# Patient Record
Sex: Female | Born: 1950
Health system: Southern US, Community
[De-identification: ages and names within clinical notes are randomized; demographics above are authoritative.]

## PROBLEM LIST (undated history)

## (undated) DIAGNOSIS — M1990 Unspecified osteoarthritis, unspecified site: Secondary | ICD-10-CM

## (undated) DIAGNOSIS — R252 Cramp and spasm: Secondary | ICD-10-CM

## (undated) HISTORY — PX: ABDOMINAL HYSTERECTOMY: SHX81

## (undated) HISTORY — PX: COLONOSCOPY WITH PROPOFOL: SHX5780

---

## 2004-03-19 ENCOUNTER — Other Ambulatory Visit: Payer: Self-pay

## 2004-07-15 ENCOUNTER — Emergency Department: Payer: Self-pay | Admitting: Emergency Medicine

## 2004-07-21 ENCOUNTER — Observation Stay: Payer: Self-pay | Admitting: General Surgery

## 2005-09-26 ENCOUNTER — Ambulatory Visit (HOSPITAL_COMMUNITY): Admission: RE | Admit: 2005-09-26 | Discharge: 2005-09-26 | Payer: Self-pay | Admitting: Specialist

## 2007-08-21 ENCOUNTER — Ambulatory Visit (HOSPITAL_COMMUNITY): Admission: RE | Admit: 2007-08-21 | Discharge: 2007-08-21 | Payer: Self-pay | Admitting: Specialist

## 2009-03-04 ENCOUNTER — Ambulatory Visit: Payer: Self-pay | Admitting: Family Medicine

## 2010-08-06 ENCOUNTER — Encounter: Payer: Self-pay | Admitting: Specialist

## 2010-10-27 ENCOUNTER — Ambulatory Visit: Payer: Self-pay

## 2010-11-06 ENCOUNTER — Ambulatory Visit: Payer: Self-pay | Admitting: Family Medicine

## 2011-01-06 ENCOUNTER — Emergency Department: Payer: Self-pay | Admitting: Internal Medicine

## 2011-02-01 ENCOUNTER — Emergency Department: Payer: Self-pay | Admitting: Emergency Medicine

## 2012-06-25 ENCOUNTER — Ambulatory Visit: Payer: Self-pay | Admitting: Family Medicine

## 2012-09-18 ENCOUNTER — Ambulatory Visit: Payer: Self-pay | Admitting: Family Medicine

## 2013-04-11 ENCOUNTER — Emergency Department: Payer: Self-pay | Admitting: Emergency Medicine

## 2014-10-14 ENCOUNTER — Ambulatory Visit
Admit: 2014-10-14 | Disposition: A | Discharge status: Home / Self Care | Payer: Self-pay | Attending: Specialist | Admitting: Specialist

## 2014-10-26 ENCOUNTER — Encounter: Payer: Self-pay | Admitting: Podiatry

## 2014-10-26 ENCOUNTER — Ambulatory Visit (INDEPENDENT_AMBULATORY_CARE_PROVIDER_SITE_OTHER): Payer: BC Managed Care – PPO

## 2014-10-26 ENCOUNTER — Ambulatory Visit (INDEPENDENT_AMBULATORY_CARE_PROVIDER_SITE_OTHER): Payer: BC Managed Care – PPO | Admitting: Podiatry

## 2014-10-26 VITALS — BP 129/77 | HR 64 | Resp 16 | Ht 64.0 in | Wt 242.0 lb

## 2014-10-26 DIAGNOSIS — L84 Corns and callosities: Secondary | ICD-10-CM | POA: Diagnosis not present

## 2014-10-26 DIAGNOSIS — M216X9 Other acquired deformities of unspecified foot: Secondary | ICD-10-CM | POA: Diagnosis not present

## 2014-10-26 DIAGNOSIS — M722 Plantar fascial fibromatosis: Secondary | ICD-10-CM | POA: Diagnosis not present

## 2014-10-26 NOTE — Progress Notes (Signed)
   Subjective:    Patient ID: Nicole Jensen, female    DOB: 01/10/1951, 64 y.o.   MRN: [id number]  HPI 64 year old female presents the office with complaints of pain to bilateral feet all side of hyperkeratotic lesions in the ball of her feet. She has a states that she feels like she is walking on a rock to the ball of her feet. She denies any history of injury or trauma to the area. She denies any overlying swelling or redness. She's had no prior treatment. No other complaints at this time.   Review of Systems  Constitutional: Positive for appetite change.  HENT: Positive for sinus pressure.   All other systems reviewed and are negative.      Objective:   Physical Exam AAO 3, NAD  DP/PT pulses palpable, CRT less than 3 seconds Protective sensation intact with Simms Weinstein monofilament, vibratory sensation intact, Achilles tendon reflex intact. There is prominent metatarsal heads plantarly with atrophy of the fat pad. There is hyperkeratotic lesions bilateral submetatarsal one and 5. Upon debridement there underlying skin was intact and there is no drainage, ulceration, or other clinical signs of infection. There is no other areas of tenderness to bilateral lower extremities. No overlying edema, erythema, increase in warmth b/l. MMT 5/5, ROM WNL No open lesions or pre-ulcerative lesions No pain with calf compression, swelling, warmth, erythema.      Assessment & Plan:  64 year old female with prominent metatarsal heads resulting in symptomatic hyperkeratotic lesions -X-rays were obtained and reviewed the patient. -Treatment options were discussed with the patient include alternatives, risks, competitions. -Hyperkeratotic lesions are sharply debrided 4 without complication/bleeding. -Dispensed metatarsal pads to help offload the metatarsal heads. -Discussed the patient that she likely would benefit from custom orthotics in the future. -If symptoms do not resolve within 4 weeks to  follow-up. Otherwise call the office if any questions or concerns.

## 2014-11-23 ENCOUNTER — Encounter: Payer: Self-pay | Admitting: Podiatry

## 2014-11-23 ENCOUNTER — Ambulatory Visit (INDEPENDENT_AMBULATORY_CARE_PROVIDER_SITE_OTHER): Payer: BC Managed Care – PPO | Admitting: Podiatry

## 2014-11-23 VITALS — Ht 64.0 in | Wt 242.0 lb

## 2014-11-23 DIAGNOSIS — Q828 Other specified congenital malformations of skin: Secondary | ICD-10-CM | POA: Diagnosis not present

## 2014-11-26 NOTE — Progress Notes (Signed)
Patient ID: Nicole Jensen, female   DOB: April 17, 1951, 64 y.o.   MRN: [id number]  Subjective: 64 year old female presents the office today with concerns of right submetatarsal 4 hyperkeratotic lesion. She states that when wearing the pads that were dispensed her last appointment it does help significantly however she discontinued have this callus on the bottom of her right foot. She states of the calluses painful particularly with pressure and weightbearing. Denies any drainage or redness from the area. Denies any systemic complaints such as fevers, chills, nausea, vomiting. No acute changes since last appointment, and no other complaints at this time.   Objective: AAO x3, NAD DP/PT pulses palpable bilaterally, CRT less than 3 seconds Protective sensation intact with Simms Weinstein monofilament, vibratory sensation intact, Achilles tendon reflex intact There is prominent metatarsal heads plantarly with atrophy of the fat pad. Right second metatarsal 4 nucleated  Hyperkeratotic lesion. Upon debridement lesion underlying skin is intact and there is no ulceration, drainage or other clinical signs of infection. No areas of pinpoint bony tenderness or pain with vibratory sensation. MMT 5/5, ROM WNL. No edema, erythema, increase in warmth to bilateral lower extremities.  No open lesions or other pre-ulcerative lesions.  No pain with calf compression, swelling, warmth, erythema  Assessment: 64 year old female right submetatarsal 4 porokeratosis  Plan: -All treatment options discussed with the patient including all alternatives, risks, complications.  -Lesion sharply debrided without complication/bleeding. Upon was placed around the area followed by salinocaine  And an occlusive bandage. Post procedure care was discussed the patient. Monitoring clinical signs or symptoms of infection and directed to call the office and medially should any occur. Dispensed offloading pads. I discussed with her  orthotics. - Follow-up as needed. -Patient encouraged to call the office with any questions, concerns, change in symptoms.

## 2015-05-27 ENCOUNTER — Ambulatory Visit (INDEPENDENT_AMBULATORY_CARE_PROVIDER_SITE_OTHER): Payer: BC Managed Care – PPO | Admitting: Family Medicine

## 2015-05-27 ENCOUNTER — Emergency Department
Admission: EM | Admit: 2015-05-27 | Discharge: 2015-05-27 | Disposition: A | Discharge status: Home / Self Care | Payer: BC Managed Care – PPO | Attending: Emergency Medicine | Admitting: Emergency Medicine

## 2015-05-27 ENCOUNTER — Encounter: Payer: Self-pay | Admitting: *Deleted

## 2015-05-27 ENCOUNTER — Encounter: Payer: Self-pay | Admitting: Family Medicine

## 2015-05-27 VITALS — BP 126/66 | HR 64 | Temp 98.1°F | Resp 16 | Wt 240.0 lb

## 2015-05-27 DIAGNOSIS — R252 Cramp and spasm: Secondary | ICD-10-CM | POA: Diagnosis not present

## 2015-05-27 DIAGNOSIS — J0110 Acute frontal sinusitis, unspecified: Secondary | ICD-10-CM | POA: Diagnosis not present

## 2015-05-27 DIAGNOSIS — J309 Allergic rhinitis, unspecified: Secondary | ICD-10-CM | POA: Insufficient documentation

## 2015-05-27 DIAGNOSIS — E559 Vitamin D deficiency, unspecified: Secondary | ICD-10-CM | POA: Insufficient documentation

## 2015-05-27 DIAGNOSIS — J0100 Acute maxillary sinusitis, unspecified: Secondary | ICD-10-CM | POA: Diagnosis not present

## 2015-05-27 DIAGNOSIS — J019 Acute sinusitis, unspecified: Secondary | ICD-10-CM | POA: Insufficient documentation

## 2015-05-27 DIAGNOSIS — R5383 Other fatigue: Secondary | ICD-10-CM

## 2015-05-27 DIAGNOSIS — R531 Weakness: Secondary | ICD-10-CM | POA: Diagnosis present

## 2015-05-27 DIAGNOSIS — D259 Leiomyoma of uterus, unspecified: Secondary | ICD-10-CM | POA: Insufficient documentation

## 2015-05-27 DIAGNOSIS — H469 Unspecified optic neuritis: Secondary | ICD-10-CM | POA: Insufficient documentation

## 2015-05-27 DIAGNOSIS — E7800 Pure hypercholesterolemia, unspecified: Secondary | ICD-10-CM | POA: Insufficient documentation

## 2015-05-27 HISTORY — DX: Unspecified osteoarthritis, unspecified site: M19.90

## 2015-05-27 HISTORY — DX: Cramp and spasm: R25.2

## 2015-05-27 LAB — COMPREHENSIVE METABOLIC PANEL
ALBUMIN: 3.2 g/dL — AB (ref 3.5–5.0)
ALT: 15 U/L (ref 14–54)
ANION GAP: 2 — AB (ref 5–15)
AST: 20 U/L (ref 15–41)
Alkaline Phosphatase: 78 U/L (ref 38–126)
BUN: 16 mg/dL (ref 6–20)
CHLORIDE: 109 mmol/L (ref 101–111)
CO2: 29 mmol/L (ref 22–32)
Calcium: 9.2 mg/dL (ref 8.9–10.3)
Creatinine, Ser: 0.84 mg/dL (ref 0.44–1.00)
GFR calc non Af Amer: >60 mL/min (ref >60)
GLUCOSE: 90 mg/dL (ref 65–99)
Potassium: 3.9 mmol/L (ref 3.5–5.1)
SODIUM: 140 mmol/L (ref 135–145)
Total Bilirubin: 0.3 mg/dL (ref 0.3–1.2)
Total Protein: 6.6 g/dL (ref 6.5–8.1)

## 2015-05-27 LAB — CBC
HCT: 37.4 % (ref 35.0–47.0)
Hemoglobin: 12 g/dL (ref 12.0–16.0)
MCH: 26.1 pg (ref 26.0–34.0)
MCHC: 32.1 g/dL (ref 32.0–36.0)
MCV: 81.2 fL (ref 80.0–100.0)
PLATELETS: 186 10*3/uL (ref 150–440)
RBC: 4.61 MIL/uL (ref 3.80–5.20)
RDW: 15.4 % — ABNORMAL HIGH (ref 11.5–14.5)
WBC: 5.2 10*3/uL (ref 3.6–11.0)

## 2015-05-27 LAB — MAGNESIUM: MAGNESIUM: 1.8 mg/dL (ref 1.7–2.4)

## 2015-05-27 LAB — TROPONIN I: Troponin I: <0.03 ng/mL (ref <0.031)

## 2015-05-27 MED ORDER — SODIUM CHLORIDE 0.9 % IV BOLUS (SEPSIS)
1000.0000 mL | Freq: Once | INTRAVENOUS | Status: AC
  Administered 2015-05-27: 1000 mL via INTRAVENOUS

## 2015-05-27 MED ORDER — AMOXICILLIN-POT CLAVULANATE 875-125 MG PO TABS: 1.0000 | ORAL_TABLET | Freq: Two times a day (BID) | ORAL | Status: DC

## 2015-05-27 MED ORDER — DILTIAZEM HCL 100 MG IV SOLR
5.0000 mg/h | Freq: Once | INTRAVENOUS | Status: DC
  Filled 2015-05-27: qty 100

## 2015-05-27 NOTE — ED Notes (Signed)
Pt arrived to ED from home reporting increased weakness beginning 1 hour ago. Pt reports having a hx right sided leg cramps that usually resolve with mustard intake but today weakness followed the leg cramp. Pt deneis lightheadedness or dizziness at this time. Pt is alert and oriented.

## 2015-05-27 NOTE — Progress Notes (Signed)
Subjective:     Patient ID: Nicole Jensen, female   DOB: 05-05-1951, 64 y.o.   MRN: [id number]  Sinusitis This is a new problem. The current episode started 1 to 4 weeks ago. The problem has been gradually worsening since onset. There has been no fever. The fever has been present for less than 1 day. Her pain is at a severity of 5/10. The pain is mild. Associated symptoms include congestion, sinus pressure and sneezing. Pertinent negatives include no chills, coughing, diaphoresis, ear pain, neck pain, shortness of breath, sore throat or swollen glands. Past treatments include nothing.  Muscle Pain This is a new problem. The current episode started today. The problem has been resolved since onset. The pain is present in the right upper leg. Associated symptoms include fatigue. Pertinent negatives include no abdominal pain, chest pain, diarrhea, eye pain, joint swelling, stiffness, shortness of breath, swollen glands, weakness (Pt felt weak this morning but has since improved. ) or wheezing. There is no swelling present.     Patient Active Problem List   Diagnosis Date Noted  . Allergic rhinitis 05/27/2015  . Leiomyoma of uterus 05/27/2015  . Hypercholesterolemia 05/27/2015  . Anterior optic neuritis 05/27/2015  . Avitaminosis D 05/27/2015   Family History  Problem Relation Age of Onset  . Hypertension Mother   . Lung cancer Mother   . Colon cancer Father   . Hypertension Sister   . Hypertension Sister    Social History   Social History  . Marital Status: Widowed    Spouse Name: N/A  . Number of Children: 1  . Years of Education: College   Occupational History  . Full-Time Abss   Social History Main Topics  . Smoking status: Never Smoker   . Smokeless tobacco: Never Used  . Alcohol Use: No  . Drug Use: No  . Sexual Activity: No   Other Topics Concern  . Not on file   Social History Narrative   Past Surgical History  Procedure Laterality Date  . Abdominal hysterectomy       partial.    No Known Allergies Previous Medications   IBUPROFEN (ADVIL,MOTRIN) 200 MG TABLET    Take 200 mg by mouth every 6 (six) hours as needed.   BP 126/66 mmHg  Pulse 64  Temp(Src) 98.1 F (36.7 C) (Oral)  Resp 16  Wt 240 lb (108.863 kg)  LMP  (LMP Unknown)   Review of Systems  Constitutional: Positive for fatigue. Negative for chills, diaphoresis, activity change and appetite change.  HENT: Positive for congestion, rhinorrhea, sinus pressure, sneezing and voice change. Negative for ear discharge, ear pain, facial swelling, hearing loss, nosebleeds, postnasal drip, sore throat, tinnitus and trouble swallowing.   Eyes: Negative for photophobia, pain, discharge, redness, itching and visual disturbance.  Respiratory: Negative for apnea, cough, choking, chest tightness, shortness of breath, wheezing and stridor.   Cardiovascular: Negative for chest pain, palpitations and leg swelling.  Gastrointestinal: Negative for abdominal pain, diarrhea, blood in stool, abdominal distention, anal bleeding and rectal pain.  Musculoskeletal: Positive for back pain (Some back stiffness). Negative for joint swelling, gait problem, stiffness, neck pain and neck stiffness.  Neurological: Negative for dizziness, tremors, seizures, syncope, speech difficulty, weakness (Pt felt weak this morning but has since improved. ), light-headedness and numbness.       Objective:   Physical Exam  Constitutional: She is oriented to person, place, and time. She appears well-developed and well-nourished.  HENT:  Head: Normocephalic and atraumatic.  Right Ear: Tympanic membrane and external ear normal.  Left Ear: Tympanic membrane and external ear normal.  Nose: Mucosal edema and rhinorrhea present. Right sinus exhibits maxillary sinus tenderness. Left sinus exhibits maxillary sinus tenderness.  Mouth/Throat: Uvula is midline and oropharynx is clear and moist.  Eyes: Conjunctivae and EOM are normal. Pupils are  equal, round, and reactive to light.  Neck: Normal range of motion. Neck supple.  Cardiovascular: Normal rate and regular rhythm.   Pulmonary/Chest: Effort normal and breath sounds normal. She has no wheezes. She has no rales.  Neurological: She is alert and oriented to person, place, and time.   BP 126/66 mmHg  Pulse 64  Temp(Src) 98.1 F (36.7 C) (Oral)  Resp 16  Wt 240 lb (108.863 kg)  LMP  (LMP Unknown)     Assessment:     1. Muscle cramps  2. Acute maxillary sinusitis, recurrence not specified     Plan:     1. Muscle cramps Will treat as needed   2. Acute maxillary sinusitis, recurrence not specified New problem. Condition is worsening. Will start medication for better control.  Patient instructed to call back if condition worsens or does not improve.   Will  Also follow up for CPE. - amoxicillin-clavulanate (AUGMENTIN) 875-125 MG tablet; Take 1 tablet by mouth 2 (two) times daily.  Dispense: 20 tablet; Refill: 0    Margarita Rana, MD

## 2015-05-27 NOTE — ED Provider Notes (Signed)
[facility] Emergency Department Provider Note  ____________________________________________  Time seen: 2:10 AM  I have reviewed the triage vital signs and the nursing notes.   HISTORY  Chief Complaint Weakness      HPI Nicole Jensen is a 64 y.o. female presents with history of generalized weakness on awakening approximately one hour ago patient states that she has a history of right leg cramps which awoke her from sleep this morning. Patient states cramps usually resolve after she eats mustard. Patient not any pain in her left leg at this time. Patient denies any chest pain or shortness of breath or dizziness. Patient states that she just feels weak all over"     Past Medical History  Diagnosis Date  . Leg cramps     right leg  . Arthritis     There are no active problems to display for this patient.   Past Surgical History  Procedure Laterality Date  . Abdominal hysterectomy      partial.     Current Outpatient Rx  Name  Route  Sig  Dispense  Refill  . ibuprofen (ADVIL,MOTRIN) 200 MG tablet   Oral   Take 200 mg by mouth every 6 (six) hours as needed.           Allergies   History reviewed. No pertinent family history.  Social History Social History  Substance Use Topics  . Smoking status: Never Smoker   . Smokeless tobacco: Never Used  . Alcohol Use: No    Review of Systems  Constitutional: Negative for fever. Eyes: Negative for visual changes. ENT: Negative for sore throat. Cardiovascular: Negative for chest pain. Respiratory: Negative for shortness of breath. Gastrointestinal: Negative for abdominal pain, vomiting and diarrhea. Genitourinary: Negative for dysuria. Musculoskeletal: Negative for back pain. Skin: Negative for rash. Neurological: Negative for headaches, focal weakness or numbness. Positive for generalized fatigue   10-point ROS otherwise  negative.  ____________________________________________   PHYSICAL EXAM:  VITAL SIGNS: ED Triage Vitals  Enc Vitals Group     BP 05/27/15 0203 133/67 mmHg     Pulse Rate 05/27/15 0203 69     Resp 05/27/15 0203 16     Temp 05/27/15 0205 97.5 F (36.4 C)     Temp Source 05/27/15 0205 Oral     SpO2 05/27/15 0203 98 %     Weight 05/27/15 0203 220 lb (99.791 kg)     Height 05/27/15 0203 5\' 4"  (1.626 m)     Head Cir --      Peak Flow --      Pain Score --      Pain Loc --      Pain Edu? --      Excl. in [place]? --      Constitutional: Alert and oriented. Well appearing and in no distress. Eyes: Conjunctivae are normal. PERRL. Normal extraocular movements. ENT   Head: Normocephalic and atraumatic.   Nose: No congestion/rhinnorhea.   Mouth/Throat: Mucous membranes are moist.   Neck: No stridor. Cardiovascular: Normal rate, regular rhythm. Normal and symmetric distal pulses are present in all extremities. No murmurs, rubs, or gallops. Respiratory: Normal respiratory effort without tachypnea nor retractions. Breath sounds are clear and equal bilaterally. No wheezes/rales/rhonchi. Gastrointestinal: Soft and nontender. No distention. There is no CVA tenderness. Genitourinary: deferred Musculoskeletal: Nontender with normal range of motion in all extremities. No joint effusions.  No lower extremity tenderness nor edema. Neurologic:  Normal speech and language. No gross focal neurologic  deficits are appreciated. Speech is normal.  Skin:  Skin is warm, dry and intact. No rash noted. Psychiatric: Mood and affect are normal. Speech and behavior are normal. Patient exhibits appropriate insight and judgment.  ____________________________________________    LABS (pertinent positives/negatives)  Labs Reviewed  CBC - Abnormal; Notable for the following:    RDW 15.4 (*)    All other components within normal limits  COMPREHENSIVE METABOLIC PANEL - Abnormal; Notable for the  following:    Albumin 3.2 (*)    Anion gap 2 (*)    All other components within normal limits  TROPONIN I  MAGNESIUM     ____________________________________________   EKG  ED ECG REPORT I, BROWN, [place] N, the attending physician, personally viewed and interpreted this ECG.   Date: 05/27/2015  EKG Time: 4:28 AM  Rate: 66  Rhythm: Normal sinus rhythm  Axis: None  Intervals: Normal  ST&T Change: None       INITIAL IMPRESSION / ASSESSMENT AND PLAN / ED COURSE  Pertinent labs & imaging results that were available during my care of the patient were reviewed by me and considered in my medical decision making (see chart for details).  No clear etiology of the patient's generalized weakness. Patient alert and oriented with No focal neurological deficits noted on exam absence of headache as such CT scan of the head was not performed. Laboratory data unremarkable.  ____________________________________________   FINAL CLINICAL IMPRESSION(S) / ED DIAGNOSES  Final diagnoses:  Other fatigue      Gregor Hams, MD 05/31/15 2235

## 2015-05-27 NOTE — ED Notes (Signed)
Patient with no complaints at this time. Respirations even and unlabored. Skin warm/dry. Discharge instructions reviewed with patient at this time. Patient given opportunity to voice concerns/ask questions. IV removed per policy and band-aid applied to site. Patient discharged at this time and left Emergency Department with steady gait, accompanied by family member. Offered wheelchair assistance, patient denied at this time.

## 2015-05-27 NOTE — Discharge Instructions (Signed)
Fatigue  Fatigue is feeling tired all of the time, a lack of energy, or a lack of motivation. Occasional or mild fatigue is often a normal response to activity or life in general. However, long-lasting (chronic) or extreme fatigue may indicate an underlying medical condition.  HOME CARE INSTRUCTIONS   Watch your fatigue for any changes. The following actions may help to lessen any discomfort you are feeling:  · Talk to your health care provider about how much sleep you need each night. Try to get the required amount every night.  · Take medicines only as directed by your health care provider.  · Eat a healthy and nutritious diet. Ask your health care provider if you need help changing your diet.  · Drink enough fluid to keep your urine clear or pale yellow.  · Practice ways of relaxing, such as yoga, meditation, massage therapy, or acupuncture.  · Exercise regularly.    · Change situations that cause you stress. Try to keep your work and personal routine reasonable.  · Do not abuse illegal drugs.  · Limit alcohol intake to no more than 1 drink per day for nonpregnant women and 2 drinks per day for men. One drink equals 12 ounces of beer, 5 ounces of wine, or 1½ ounces of hard liquor.  · Take a multivitamin, if directed by your health care provider.  SEEK MEDICAL CARE IF:   · Your fatigue does not get better.  · You have a fever.    · You have unintentional weight loss or gain.  · You have headaches.    · You have difficulty:      Falling asleep.    Sleeping throughout the night.  · You feel angry, guilty, anxious, or sad.     · You are unable to have a bowel movement (constipation).    · You skin is dry.     · Your legs or another part of your body is swollen.    SEEK IMMEDIATE MEDICAL CARE IF:   · You feel confused.    · Your vision is blurry.  · You feel faint or pass out.    · You have a severe headache.    · You have severe abdominal, pelvic, or back pain.    · You have chest pain, shortness of breath, or an  irregular or fast heartbeat.    · You are unable to urinate or you urinate less than normal.    · You develop abnormal bleeding, such as bleeding from the rectum, vagina, nose, lungs, or nipples.  · You vomit blood.     · You have thoughts about harming yourself or committing suicide.    · You are worried that you might harm someone else.       This information is not intended to replace advice given to you by your health care provider. Make sure you discuss any questions you have with your health care provider.     Document Released: 04/29/2007 Document Revised: 07/23/2014 Document Reviewed: 11/03/2013  Elsevier Interactive Patient Education ©2016 [employer]

## 2015-06-13 ENCOUNTER — Encounter: Payer: Self-pay | Admitting: Family Medicine

## 2015-06-24 ENCOUNTER — Encounter: Payer: Self-pay | Admitting: Family Medicine

## 2015-06-24 ENCOUNTER — Ambulatory Visit (INDEPENDENT_AMBULATORY_CARE_PROVIDER_SITE_OTHER): Payer: BC Managed Care – PPO | Admitting: Family Medicine

## 2015-06-24 VITALS — BP 116/80 | HR 70 | Temp 97.9°F | Resp 16 | Wt 243.0 lb

## 2015-06-24 DIAGNOSIS — J0100 Acute maxillary sinusitis, unspecified: Secondary | ICD-10-CM | POA: Diagnosis not present

## 2015-06-24 DIAGNOSIS — J309 Allergic rhinitis, unspecified: Secondary | ICD-10-CM

## 2015-06-24 DIAGNOSIS — J029 Acute pharyngitis, unspecified: Secondary | ICD-10-CM | POA: Diagnosis not present

## 2015-06-24 LAB — POCT RAPID STREP A (OFFICE): Rapid Strep A Screen: NEGATIVE

## 2015-06-24 MED ORDER — FLUTICASONE PROPIONATE 50 MCG/ACT NA SUSP: 2.0000 | Freq: Every day | NASAL | Status: DC

## 2015-06-24 MED ORDER — AMOXICILLIN-POT CLAVULANATE 875-125 MG PO TABS: 1.0000 | ORAL_TABLET | Freq: Two times a day (BID) | ORAL | Status: DC

## 2015-06-24 NOTE — Progress Notes (Signed)
Subjective:    Patient ID: Nicole Jensen, female    DOB: 11-08-1950, 64 y.o.   MRN: [id number]  URI  This is a new problem. The current episode started 1 to 4 weeks ago (x 2 weeks). The problem has been gradually worsening (Feels liek something stuck in back of throat.  ). There has been no fever. Associated symptoms include congestion, coughing (productive with green/ gray sputum), headaches, rhinorrhea, sinus pain, sneezing and a sore throat. Pertinent negatives include no abdominal pain, chest pain, diarrhea, dysuria, ear pain, nausea, neck pain, plugged ear sensation, swollen glands, vomiting or wheezing. Treatments tried: cough drops, gargling vinegar. No prescription mediication.   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      Review of Systems  HENT: Positive for congestion, rhinorrhea, sneezing and sore throat. Negative for ear pain.   Respiratory: Positive for cough (productive with green/ gray sputum). Negative for wheezing.   Cardiovascular: Negative for chest pain.  Gastrointestinal: Negative for nausea, vomiting, abdominal pain and diarrhea.  Genitourinary: Negative for dysuria.  Musculoskeletal: Negative for neck pain.  Neurological: Positive for headaches.   BP 116/80 mmHg  Pulse 70  Temp(Src) 97.9 F (36.6 C) (Oral)  Resp 16  Wt 243 lb (110.224 kg)  SpO2 100%  LMP  (LMP Unknown)   Patient Active Problem List   Diagnosis Date Noted  . Allergic rhinitis 05/27/2015  . Leiomyoma of uterus 05/27/2015  . Hypercholesterolemia 05/27/2015  . Anterior optic neuritis 05/27/2015  . Avitaminosis D 05/27/2015  . Sinusitis, acute 05/27/2015   Past Medical History  Diagnosis Date  . Leg cramps     right leg  . Arthritis    Current Outpatient Prescriptions on File Prior to Visit  Medication Sig  . aspirin 81 MG tablet Take 81 mg by mouth daily.  Marland Kitchen ibuprofen (ADVIL,MOTRIN) 200 MG tablet Take 200 mg by mouth every 6 (six) hours as needed.   No  current facility-administered medications on file prior to visit.   No Known Allergies Past Surgical History  Procedure Laterality Date  . Abdominal hysterectomy      partial.    Social History   Social History  . Marital Status: Widowed    Spouse Name: N/A  . Number of Children: 1  . Years of Education: College   Occupational History  . Full-Time Abss   Social History Main Topics  . Smoking status: Never Smoker   . Smokeless tobacco: Never Used  . Alcohol Use: No  . Drug Use: No  . Sexual Activity: No   Other Topics Concern  . Not on file   Social History Narrative   Family History  Problem Relation Age of Onset  . Hypertension Mother   . Lung cancer Mother   . Colon cancer Father   . Hypertension Sister   . Hypertension Sister       Objective:   Physical Exam  Constitutional: She is oriented to person, place, and time. She appears well-developed and well-nourished.  HENT:  Head: Normocephalic and atraumatic.  Right Ear: External ear normal.  Left Ear: External ear normal.  Nose: Mucosal edema and rhinorrhea present. Right sinus exhibits maxillary sinus tenderness. Left sinus exhibits maxillary sinus tenderness.  Mouth/Throat: Oropharynx is clear and moist.  Eyes: Conjunctivae and EOM are normal. Pupils are equal, round, and reactive to light.  Neck: Normal range of motion. Neck supple.  Cardiovascular: Normal rate and regular rhythm.   Pulmonary/Chest: Effort  normal and breath sounds normal.  Neurological: She is alert and oriented to person, place, and time.  Psychiatric: She has a normal mood and affect. Her behavior is normal. Judgment and thought content normal.   BP 116/80 mmHg  Pulse 70  Temp(Src) 97.9 F (36.6 C) (Oral)  Resp 16  Wt 243 lb (110.224 kg)  SpO2 100%  LMP  (LMP Unknown)      Assessment & Plan:  1. Sore throat Strep negative.  - POCT rapid strep A  2. Allergic rhinitis, unspecified allergic rhinitis type Uncontrolled. Start  medication.  - fluticasone (FLONASE) 50 MCG/ACT nasal spray; Place 2 sprays into both nostrils daily.  Dispense: 16 g; Refill: 2  3. Acute maxillary sinusitis, recurrence not specified New problem .Worsening. Start medication. Patient instructed to call back if condition worsens or does not improve.    - amoxicillin-clavulanate (AUGMENTIN) 875-125 MG tablet; Take 1 tablet by mouth 2 (two) times daily.  Dispense: 28 tablet; Refill: 0  Nicole Rana, MD

## 2015-06-28 ENCOUNTER — Telehealth: Payer: Self-pay | Admitting: Family Medicine

## 2015-06-28 NOTE — Telephone Encounter (Signed)
Pt called wanting to know if she can get a note for work because the antibiotic is causing her to have diarrhea.  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  She wants to know if it can say to excuse her until she finishes the antibiotic.  Can pick the note up today around lunch.  Her call back is [phone]  Thanks Con Memos

## 2015-06-28 NOTE — Telephone Encounter (Signed)
Advised patient

## 2015-06-28 NOTE — Telephone Encounter (Signed)
Wrote note. Thanks.

## 2015-08-24 ENCOUNTER — Ambulatory Visit (INDEPENDENT_AMBULATORY_CARE_PROVIDER_SITE_OTHER): Payer: BC Managed Care – PPO | Admitting: Family Medicine

## 2015-08-24 ENCOUNTER — Encounter: Payer: Self-pay | Admitting: Family Medicine

## 2015-08-24 ENCOUNTER — Telehealth: Payer: Self-pay | Admitting: Family Medicine

## 2015-08-24 VITALS — BP 116/82 | HR 66 | Temp 97.8°F | Resp 16 | Ht 64.0 in | Wt 251.0 lb

## 2015-08-24 DIAGNOSIS — Z1211 Encounter for screening for malignant neoplasm of colon: Secondary | ICD-10-CM

## 2015-08-24 DIAGNOSIS — J309 Allergic rhinitis, unspecified: Secondary | ICD-10-CM

## 2015-08-24 DIAGNOSIS — R928 Other abnormal and inconclusive findings on diagnostic imaging of breast: Secondary | ICD-10-CM

## 2015-08-24 DIAGNOSIS — Z0000 Encounter for general adult medical examination without abnormal findings: Secondary | ICD-10-CM

## 2015-08-24 DIAGNOSIS — R252 Cramp and spasm: Secondary | ICD-10-CM

## 2015-08-24 DIAGNOSIS — E7800 Pure hypercholesterolemia, unspecified: Secondary | ICD-10-CM | POA: Diagnosis not present

## 2015-08-24 DIAGNOSIS — R7309 Other abnormal glucose: Secondary | ICD-10-CM

## 2015-08-24 DIAGNOSIS — N6320 Unspecified lump in the left breast, unspecified quadrant: Secondary | ICD-10-CM

## 2015-08-24 LAB — POCT URINALYSIS DIPSTICK
BILIRUBIN UA: NEGATIVE
GLUCOSE UA: NEGATIVE
Ketones, UA: NEGATIVE
Leukocytes, UA: NEGATIVE
NITRITE UA: NEGATIVE
Protein, UA: NEGATIVE
Spec Grav, UA: 1.025
Urobilinogen, UA: 0.2
pH, UA: 6

## 2015-08-24 NOTE — Telephone Encounter (Signed)
The diagnosis on order for mammogram is abnormal mammogram f/u.Can you change diagnosis,Thanks

## 2015-08-24 NOTE — Patient Instructions (Addendum)
Please call the [facility] at [facility] to schedule this at [phone]  Please start over the counter Claritin 10 mg daily.

## 2015-08-24 NOTE — Telephone Encounter (Signed)
Per [facility] after last mammogram that was done there in 2012 pt was cleared to go back to screening.The order is for diagnostic.Is this correct ?

## 2015-08-24 NOTE — Progress Notes (Signed)
Patient ID: SEARRA LEY, female   DOB: [birth year], 65 y.o.   MRN: [id number]       Patient: Nicole Jensen, Female    DOB: [birth year], 65 y.o.   MRN: [id number] Visit Date: 08/24/2015  Today's Provider: Margarita Rana, MD   Chief Complaint  Patient presents with  . Annual Exam  . URI   Subjective:    Annual physical exam Nicole Jensen is a 65 y.o. female who presents today for health maintenance and complete physical. She feels fairly well. She reports exercising none. She reports she is sleeping well.  09/11/12 CPE 09/11/12 Pap-neg; s/p hysterectomy 11/06/10 BI-RADS 2 08/28/11 Colon-polyps, diverticulosis, recheck in 3 yrs 09/18/12 BMD-WNL  Lab Results  Component Value Date   WBC 5.2 05/27/2015   HGB 12.0 05/27/2015   HCT 37.4 05/27/2015   PLT 186 05/27/2015   GLUCOSE 90 05/27/2015   ALT 15 05/27/2015   AST 20 05/27/2015   NA 140 05/27/2015   K 3.9 05/27/2015   CL 109 05/27/2015   CREATININE 0.84 05/27/2015   BUN 16 05/27/2015   CO2 29 05/27/2015  -----------------------------------------------------------------  Upper Respiratory Infection: Patient complains of symptoms of a URI. Symptoms include congestion and cough. Onset of symptoms was 2 weeks ago, gradually improving since that time. She also c/o post nasal drip and productive cough with  green colored sputum for the past 1 weeks .  She is drinking moderate amounts of fluids. Patient was seen on 06/24/15 and treated with Augmentin for 10 days.    Review of Systems  Constitutional: Negative.   HENT: Positive for congestion and postnasal drip.   Eyes: Negative.   Respiratory: Positive for cough.   Cardiovascular: Negative.   Gastrointestinal: Negative.   Endocrine: Negative.   Genitourinary: Negative.   Musculoskeletal: Positive for arthralgias.  Skin: Negative.   Allergic/Immunologic: Negative.   Neurological: Negative.   Hematological: Negative.   Psychiatric/Behavioral: Negative.     Social  History      She  reports that she has never smoked. She has never used smokeless tobacco. She reports that she does not drink alcohol or use illicit drugs.       Social History   Social History  . Marital Status: Widowed    Spouse Name: N/A  . Number of Children: 1  . Years of Education: College   Occupational History  . Full-Time Abss   Social History Main Topics  . Smoking status: Never Smoker   . Smokeless tobacco: Never Used  . Alcohol Use: No  . Drug Use: No  . Sexual Activity: No   Other Topics Concern  . None   Social History Narrative    Past Medical History  Diagnosis Date  . Leg cramps     right leg  . Arthritis      Patient Active Problem List   Diagnosis Date Noted  . Allergic rhinitis 05/27/2015  . Leiomyoma of uterus 05/27/2015  . Hypercholesterolemia 05/27/2015  . Anterior optic neuritis 05/27/2015  . Avitaminosis D 05/27/2015  . Sinusitis, acute 05/27/2015    Past Surgical History  Procedure Laterality Date  . Abdominal hysterectomy      partial. pt reports she has cervix    Family History        Family Status  Relation Status Death Age  . Mother Deceased 75  . Father Deceased 4's  . Sister Alive   . Brother Deceased   . Brother Deceased   . Sister Alive   .  Sister Alive   . Sister Alive   . Brother Alive   . Brother Alive         Her family history includes Colon cancer in her father; Hypertension in her mother, sister, and sister; Lung cancer in her mother.    No Known Allergies  Previous Medications   ASPIRIN 81 MG TABLET    Take 81 mg by mouth daily.   CHOLECALCIFEROL (VITAMIN D3) 5000 UNITS TABS    Take by mouth.   FLUTICASONE (FLONASE) 50 MCG/ACT NASAL SPRAY    Place 2 sprays into both nostrils daily.   GARLIC PO    Take by mouth.   IBUPROFEN (ADVIL,MOTRIN) 200 MG TABLET    Take 200 mg by mouth every 6 (six) hours as needed.    Patient Care Team: Margarita Rana, MD as PCP - General (Family Medicine)      Objective:   Vitals: BP 116/82 mmHg  Pulse 66  Temp(Src) 97.8 F (36.6 C) (Oral)  Resp 16  Ht 5\' 4"  (1.626 m)  Wt 251 lb (113.853 kg)  BMI 43.06 kg/m2  SpO2 100%  LMP  (LMP Unknown)   Physical Exam  Constitutional: She is oriented to person, place, and time. She appears well-developed and well-nourished.  HENT:  Head: Normocephalic and atraumatic.  Right Ear: Tympanic membrane, external ear and ear canal normal.  Left Ear: Tympanic membrane, external ear and ear canal normal.  Nose: Nose normal.  Mouth/Throat: Uvula is midline, oropharynx is clear and moist and mucous membranes are normal.  Swollen turbinates  Eyes: Conjunctivae, EOM and lids are normal. Pupils are equal, round, and reactive to light.  Neck: Trachea normal and normal range of motion. Neck supple. Carotid bruit is not present. No thyroid mass and no thyromegaly present.  Cardiovascular: Normal rate, regular rhythm and normal heart sounds.   Pulmonary/Chest: Effort normal and breath sounds normal.  Abdominal: Soft. Normal appearance and bowel sounds are normal. There is no hepatosplenomegaly. There is no tenderness.  Genitourinary: No breast swelling, tenderness (does have some fullness in upper outer quadarnt at 1 oclock but not tender. ) or discharge.  Musculoskeletal: Normal range of motion.  Lymphadenopathy:    She has no cervical adenopathy.    She has no axillary adenopathy.  Neurological: She is alert and oriented to person, place, and time. She has normal strength. No cranial nerve deficit.  Skin: Skin is warm, dry and intact.  Psychiatric: She has a normal mood and affect. Her speech is normal and behavior is normal. Judgment and thought content normal. Cognition and memory are normal.     Depression Screen PHQ 2/9 Scores 08/24/2015  PHQ - 2 Score 0      Assessment & Plan:     Routine Health Maintenance and Physical Exam  Exercise Activities and Dietary recommendations Goals    None       1. Annual physical exam Encouraged patient to eat healthy and exercise.   - POCT urinalysis dipstick  2. Colon cancer screening Will schedule.  - Ambulatory referral to Gastroenterology  3. Hypercholesterolemia Will check labs.  - Lipid Panel With LDL/HDL Ratio - TSH  4. Abnormal mammogram of left breast Does have palpable lesion. History of abnormal mammogram which further views showed was ok. Recheck diagnostic.  - MM Digital Diagnostic Bilat; Future - US BREAST COMPLETE UNI LEFT INC AXILLA; Future - US BREAST LTD UNI RIGHT INC AXILLA; Future  5. Abnormal blood sugar Check labs.  - Hemoglobin A1c -  Comprehensive metabolic panel  6. Cramp of both lower extremities Will check labs - Magnesium  7. Allergic rhinitis, unspecified allergic rhinitis type Trial of OTC Claritin. Call if worsens or does not improve and may consider antibiotic.    Patient was seen and examined by Jerrell Belfast, MD, and note scribed by Lynford Humphrey, [place].   I have reviewed the document for accuracy and completeness and I agree with above. Jerrell Belfast, MD   Margarita Rana, MD      --------------------------------------------------------------------

## 2015-08-24 NOTE — Telephone Encounter (Signed)
Yes. Has palpable fullness at 1:00 on left breast.  Thanks.

## 2015-08-24 NOTE — Addendum Note (Signed)
Addended by: Silvio Clayman on: 08/24/2015 04:50 PM   Modules accepted: Orders

## 2015-08-24 NOTE — Telephone Encounter (Signed)
Ordered new mammo and [place]. Renaldo Fiddler, CMA

## 2015-09-02 ENCOUNTER — Other Ambulatory Visit: Payer: Self-pay | Admitting: Family Medicine

## 2015-09-03 LAB — COMPREHENSIVE METABOLIC PANEL
ALT: 14 IU/L (ref 0–32)
AST: 18 IU/L (ref 0–40)
Albumin/Globulin Ratio: 1.1 (ref 1.1–2.5)
Albumin: 3.8 g/dL (ref 3.6–4.8)
Alkaline Phosphatase: 94 IU/L (ref 39–117)
BILIRUBIN TOTAL: 0.3 mg/dL (ref 0.0–1.2)
BUN/Creatinine Ratio: 11 (ref 11–26)
BUN: 10 mg/dL (ref 8–27)
CHLORIDE: 102 mmol/L (ref 96–106)
CO2: 24 mmol/L (ref 18–29)
Calcium: 9.3 mg/dL (ref 8.7–10.3)
Creatinine, Ser: 0.87 mg/dL (ref 0.57–1.00)
GFR calc non Af Amer: 71 mL/min/{1.73_m2} (ref >59)
GFR, EST AFRICAN AMERICAN: 82 mL/min/{1.73_m2} (ref >59)
GLUCOSE: 85 mg/dL (ref 65–99)
Globulin, Total: 3.5 g/dL (ref 1.5–4.5)
Potassium: 4.1 mmol/L (ref 3.5–5.2)
Sodium: 140 mmol/L (ref 134–144)
TOTAL PROTEIN: 7.3 g/dL (ref 6.0–8.5)

## 2015-09-03 LAB — LIPID PANEL WITH LDL/HDL RATIO
CHOLESTEROL TOTAL: 218 mg/dL — AB (ref 100–199)
HDL: 67 mg/dL (ref >39)
LDL Calculated: 138 mg/dL — ABNORMAL HIGH (ref 0–99)
LDl/HDL Ratio: 2.1 ratio units (ref 0.0–3.2)
TRIGLYCERIDES: 66 mg/dL (ref 0–149)
VLDL CHOLESTEROL CAL: 13 mg/dL (ref 5–40)

## 2015-09-03 LAB — MAGNESIUM: MAGNESIUM: 1.9 mg/dL (ref 1.6–2.3)

## 2015-09-03 LAB — TSH: TSH: 3.16 u[IU]/mL (ref 0.450–4.500)

## 2015-09-03 LAB — HEMOGLOBIN A1C
Est. average glucose Bld gHb Est-mCnc: 126 mg/dL
Hgb A1c MFr Bld: 6 % — ABNORMAL HIGH (ref 4.8–5.6)

## 2015-09-05 ENCOUNTER — Telehealth: Payer: Self-pay

## 2015-09-05 NOTE — Telephone Encounter (Signed)
Advised patient as below.  

## 2015-09-05 NOTE — Telephone Encounter (Signed)
-----   Message from Margarita Rana, MD sent at 09/03/2015 10:50 AM EST ----- Labs stable.  Cholesterol is elevated, but has high good cholesterol.  BLood sugar at very beginnings of high sugar, early pre-diabetes. . Recommend eat healthy and exercise and recheck annually. Thanks.

## 2015-09-05 NOTE — Telephone Encounter (Signed)
Left message to call back  

## 2015-09-08 ENCOUNTER — Other Ambulatory Visit: Payer: BC Managed Care – PPO

## 2015-09-08 ENCOUNTER — Ambulatory Visit: Payer: BC Managed Care – PPO

## 2015-09-13 ENCOUNTER — Telehealth: Payer: Self-pay | Admitting: Family Medicine

## 2015-09-13 NOTE — Telephone Encounter (Signed)
Do not think [employer] has any good long term data that is a good weight loss strategy.   Did not remember talking about or recommending this at last ov?   Please see what this is about. Thanks.

## 2015-09-13 NOTE — Telephone Encounter (Signed)
Left message to call back. Please call [phone] instead of number listed below. Thanks!

## 2015-09-13 NOTE — Telephone Encounter (Signed)
Pt is requesting a letter stating she it is in her best interest for her health to take [employer] products for weight management.  Pt is wanting to purchase the products with a benefit card and will need a letter from her doctor to be able to do this.  CB#[phone]/MW

## 2015-09-14 NOTE — Telephone Encounter (Signed)
Pt called back.   I gave her the message from Dr. Venia Minks.  She said ok and she might call back for an a ppt for wt loss mgmt.

## 2015-09-16 ENCOUNTER — Ambulatory Visit
Admission: RE | Admit: 2015-09-16 | Discharge: 2015-09-16 | Disposition: A | Discharge status: Home / Self Care | Payer: BC Managed Care – PPO | Source: Ambulatory Visit | Attending: Family Medicine | Admitting: Family Medicine

## 2015-09-16 ENCOUNTER — Other Ambulatory Visit: Payer: Self-pay | Admitting: Family Medicine

## 2015-09-16 DIAGNOSIS — N6320 Unspecified lump in the left breast, unspecified quadrant: Secondary | ICD-10-CM

## 2015-09-16 DIAGNOSIS — R928 Other abnormal and inconclusive findings on diagnostic imaging of breast: Secondary | ICD-10-CM | POA: Insufficient documentation

## 2015-12-13 ENCOUNTER — Ambulatory Visit (INDEPENDENT_AMBULATORY_CARE_PROVIDER_SITE_OTHER): Payer: BC Managed Care – PPO | Admitting: Podiatry

## 2015-12-13 ENCOUNTER — Encounter: Payer: Self-pay | Admitting: Podiatry

## 2015-12-13 VITALS — BP 138/75 | HR 70 | Resp 18

## 2015-12-13 DIAGNOSIS — Q828 Other specified congenital malformations of skin: Secondary | ICD-10-CM

## 2015-12-13 NOTE — Progress Notes (Signed)
Patient ID: Nicole Jensen, female   DOB: 1951/06/02, 65 y.o.   MRN: [id number]  Subjective: 65 year old female presents the office today with concerns of right submetatarsal 4 hyperkeratotic lesion and for lesions on her left foot. The areas are painful with shoes and pressure. No redness, drainage, swelling. No numbness or tingling. Denies any systemic complaints such as fevers, chills, nausea, vomiting. No acute changes since last appointment, and no other complaints at this time.   Objective: AAO x3, NAD DP/PT pulses palpable bilaterally, CRT less than 3 seconds Hyperkeratotic lesion left foot second metatarsal one and 5 in right foot some metatarsal 4. Upon debridement no underlying ulceration, drainage or other signs of infection. There is prominence the metatarsal heads plantarly with atrophy of fat pad. Cavus foot type present. No other areas of tenderness. MMT 5/5, ROM WNL. No edema, erythema, increase in warmth to bilateral lower extremities.  No open lesions or other pre-ulcerative lesions.  No pain with calf compression, swelling, warmth, erythema  Assessment: 65 year old female porokeratosis 3  Plan: -All treatment options discussed with the patient including all alternatives, risks, complications.  -Lesions debrided 3 without complications or bleeding. I discussed offloading has also discussed custom orthotics with offloading features to help take pressure off these symptomatic areas. She was scanned for orthotics today but we will check with insurance before sending him to get made. -Follow-up the symptoms recur or worsen.  Celesta Gentile, DPM

## 2016-01-18 ENCOUNTER — Ambulatory Visit (INDEPENDENT_AMBULATORY_CARE_PROVIDER_SITE_OTHER): Payer: BC Managed Care – PPO | Admitting: *Deleted

## 2016-01-18 DIAGNOSIS — M722 Plantar fascial fibromatosis: Secondary | ICD-10-CM

## 2016-01-18 DIAGNOSIS — M216X9 Other acquired deformities of unspecified foot: Secondary | ICD-10-CM

## 2016-01-19 NOTE — Progress Notes (Signed)
Patient presents to [facility], but orthotics not in the office. Benefits for orthotics were being checked prior to order.

## 2016-01-30 NOTE — Progress Notes (Signed)
Tammy called pt, pt states that scanned were done but when checked in folder of ipad there were no record of scans. Pt to come by 7/17 to be rescanned.

## 2016-07-12 LAB — HM COLONOSCOPY

## 2016-10-31 ENCOUNTER — Ambulatory Visit (INDEPENDENT_AMBULATORY_CARE_PROVIDER_SITE_OTHER): Payer: BC Managed Care – PPO | Admitting: Physician Assistant

## 2016-10-31 ENCOUNTER — Encounter: Payer: Self-pay | Admitting: Physician Assistant

## 2016-10-31 VITALS — BP 124/80 | HR 72 | Temp 97.7°F | Resp 16 | Wt 245.0 lb

## 2016-10-31 DIAGNOSIS — M7502 Adhesive capsulitis of left shoulder: Secondary | ICD-10-CM

## 2016-10-31 DIAGNOSIS — J309 Allergic rhinitis, unspecified: Secondary | ICD-10-CM

## 2016-10-31 MED ORDER — FLUTICASONE PROPIONATE 50 MCG/ACT NA SUSP: 2.0000 | Freq: Every day | NASAL | 3 refills | Status: DC

## 2016-10-31 NOTE — Patient Instructions (Signed)
Adhesive Capsulitis Adhesive capsulitis is inflammation of the tendons and ligaments that surround the shoulder joint (shoulder capsule). This condition causes the shoulder to become stiff and painful to move. Adhesive capsulitis is also called frozen shoulder. What are the causes? This condition may be caused by:  An injury to the shoulder joint.  Straining the shoulder.  Not moving the shoulder for a period of time. This can happen if your arm was injured or in a sling.  Long-standing health problems, such as:  Diabetes.  Thyroid problems.  Heart disease.  Stroke.  Rheumatoid arthritis.  Lung disease. In some cases, the cause may not be known. What increases the risk? This condition is more likely to develop in:  Women.  People who are older than 65 years of age. What are the signs or symptoms? Symptoms of this condition include:  Pain in the shoulder when moving the arm. There may also be pain when parts of the shoulder are touched. The pain is worse at night or when at rest.  Soreness or aching in the shoulder.  Inability to move the shoulder normally.  Muscle spasms. How is this diagnosed? This condition is diagnosed with a physical exam and imaging tests, such as an X-ray or MRI. How is this treated? This condition may be treated with:  Treatment of the underlying cause or condition.  Physical therapy. This involves performing exercises to get the shoulder moving again.  Medicine. Medicine may be given to relieve pain, inflammation, or muscle spasms.  Steroid injections into the shoulder joint.  Shoulder manipulation. This is a procedure to move the shoulder into another position. It is done after you are given a medicine to make you fall asleep (general anesthetic). The joint may also be injected with salt water at high pressure to break down scarring.  Surgery. This may be done in severe cases when other treatments have failed. Although most people  recover completely from adhesive capsulitis, some may not regain the full movement of the shoulder. Follow these instructions at home:  Take over-the-counter and prescription medicines only as told by your health care provider.  If you are being treated with physical therapy, follow instructions from your physical therapist.  Avoid exercises that put a lot of demand on your shoulder, such as throwing. These exercises can make pain worse.  If directed, apply ice to the injured area:  Put ice in a plastic bag.  Place a towel between your skin and the bag.  Leave the ice on for 20 minutes, 2-3 times per day. Contact a health care provider if:  You develop new symptoms.  Your symptoms get worse. This information is not intended to replace advice given to you by your health care provider. Make sure you discuss any questions you have with your health care provider. Document Released: 04/29/2009 Document Revised: 12/08/2015 Document Reviewed: 10/25/2014 Elsevier Interactive Patient Education  2017 [employer].

## 2016-10-31 NOTE — Progress Notes (Signed)
Patient: Nicole Jensen Female    DOB: [birth year]   66 y.o.   MRN: [id number] Visit Date: 10/31/2016  Today's Provider: Trinna Post, PA-C   Chief Complaint  Patient presents with  . Shoulder Pain    Left shoulder; started "Months ago"   Subjective:    Shoulder Pain   The pain is present in the left shoulder. This is a chronic problem. The current episode started more than 1 month ago. There has been no history of extremity trauma. Progression since onset: Pt reports the "pain" has improved some, but her range of motion is limited. The quality of the pain is described as dull. Associated symptoms include joint locking, a limited range of motion, numbness, stiffness and tingling. Pertinent negatives include no fever. She has tried acetaminophen and heat for the symptoms. The treatment provided mild relief.   Nicole Jensen is a 66 y/o woman who works as a Recruitment consultant and in Camera operator who is presenting with left shoulder pain ongoing for 2-3 months. She is left hand dominant. She doesn't remember any inciting event. She says the pain is 5/10 and has improved but has limited ROM in her left shoulder. She finds it difficult to raise her arm above her head. She has to prop her left arm on a cabinet to put deoderant on. 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 She has stopped using her left arm in overhead motions like cleaning windows, she uses her right arm. She has had some numbness and tingling in her hands that improved with using her carpal tunnel brace. She is concerned there might be a blockage in her vessels causing these symptoms.   No Known Allergies   Current Outpatient Prescriptions:  .  aspirin 81 MG tablet, Take 81 mg by mouth daily., Disp: , Rfl:  .  Cholecalciferol (VITAMIN D3) 5000 UNITS TABS, Take by mouth., Disp: , Rfl:  .  fluticasone (FLONASE) 50 MCG/ACT nasal spray, Place 2 sprays into  both nostrils daily., Disp: 16 g, Rfl: 2 .  GARLIC PO, Take by mouth., Disp: , Rfl:  .  ibuprofen (ADVIL,MOTRIN) 200 MG tablet, Take 200 mg by mouth every 6 (six) hours as needed., Disp: , Rfl:   Review of Systems  Constitutional: Positive for fatigue. Negative for activity change, appetite change, chills, diaphoresis, fever and unexpected weight change.  Respiratory: Positive for cough. Negative for apnea, choking, chest tightness, shortness of breath, wheezing and stridor.   Cardiovascular: Negative.   Gastrointestinal: Negative.   Musculoskeletal: Positive for arthralgias, joint swelling, myalgias (Leg cramping at times.) and stiffness. Negative for back pain, gait problem, neck pain and neck stiffness.  Allergic/Immunologic: Positive for environmental allergies.  Neurological: Positive for tingling and numbness. Negative for dizziness, light-headedness and headaches.  Hematological: Does not bruise/bleed easily.    Social History  Substance Use Topics  . Smoking status: Never Smoker  . Smokeless tobacco: Never Used  . Alcohol use No   Objective:   BP 124/80 (BP Location: Right Arm, Patient Position: Sitting, Cuff Size: Large)   Pulse 72   Temp 97.7 F (36.5 C) (Oral)   Resp 16   Wt 245 lb (111.1 kg)   LMP  (LMP Unknown)   BMI 42.05 kg/m  Vitals:   10/31/16 1410  BP: 124/80  Pulse: 72  Resp: 16  Temp: 97.7 F (36.5 C)  TempSrc: Oral  Weight: 245 lb (111.1 kg)     Physical Exam  Constitutional: She is oriented to person, place, and time. She appears well-developed and well-nourished.  Cardiovascular: Normal rate, regular rhythm and intact distal pulses.   Musculoskeletal: She exhibits no edema, tenderness or deformity.  There is some slight pain on left side with Neer's, but normal on right. She has limited active ROM in her left arm past 90 degrees, this increases to about 110 with passive ROM. Negative belly press and lift off. No pain with internal or external  rotations. Strength 5/5 BUE with sensation grossly in tact.   Neurological: She is alert and oriented to person, place, and time.  Skin: Skin is warm and dry.        Assessment & Plan:     1. Adhesive capsulitis of left shoulder  Shoulder is resistant to movement, both active and passive but it doesn't appear to have any strong impingement signs or signs of rotator cuff tear. No bony tenderness or history of injury.   - Ambulatory referral to Physical Therapy  2. Allergic rhinitis, unspecified seasonality, unspecified trigger  Pulled to refill flonase.   - fluticasone (FLONASE) 50 MCG/ACT nasal spray; Place 2 sprays into both nostrils daily.  Dispense: 16 g; Refill: 3  Return in about 1 month (around 11/30/2016) for CPE.  The entirety of the information documented in the History of Present Illness, Review of Systems and Physical Exam were personally obtained by me. Portions of this information were initially documented by Ashley Royalty, CMA and reviewed by me for thoroughness and accuracy.         Trinna Post, PA-C  [facility]

## 2016-11-02 ENCOUNTER — Ambulatory Visit (INDEPENDENT_AMBULATORY_CARE_PROVIDER_SITE_OTHER): Payer: BC Managed Care – PPO | Admitting: Physician Assistant

## 2016-11-02 ENCOUNTER — Encounter: Payer: Self-pay | Admitting: Physician Assistant

## 2016-11-02 ENCOUNTER — Ambulatory Visit: Payer: Self-pay | Admitting: Physician Assistant

## 2016-11-02 VITALS — BP 122/82 | HR 64 | Temp 97.6°F | Resp 16 | Wt 245.0 lb

## 2016-11-02 DIAGNOSIS — G8929 Other chronic pain: Secondary | ICD-10-CM | POA: Diagnosis not present

## 2016-11-02 DIAGNOSIS — R7309 Other abnormal glucose: Secondary | ICD-10-CM

## 2016-11-02 DIAGNOSIS — M25512 Pain in left shoulder: Secondary | ICD-10-CM

## 2016-11-02 DIAGNOSIS — R7303 Prediabetes: Secondary | ICD-10-CM

## 2016-11-02 DIAGNOSIS — E7800 Pure hypercholesterolemia, unspecified: Secondary | ICD-10-CM | POA: Diagnosis not present

## 2016-11-02 DIAGNOSIS — R42 Dizziness and giddiness: Secondary | ICD-10-CM

## 2016-11-02 LAB — POCT GLYCOSYLATED HEMOGLOBIN (HGB A1C): Hemoglobin A1C: 6

## 2016-11-02 NOTE — Progress Notes (Signed)
Patient: Nicole Jensen Female    DOB: [birth year]   66 y.o.   MRN: [id number] Visit Date: 11/02/2016  Today's Provider: Trinna Post, PA-C   Chief Complaint  Patient presents with  . Dizziness    Happen yesterday; pt says it is better today.   Subjective:    Dizziness  This is a new problem. The current episode started yesterday. The problem has been resolved. Associated symptoms include arthralgias. Pertinent negatives include no abdominal pain, chest pain, chills, congestion, coughing, diaphoresis, fatigue, fever, headaches, joint swelling, myalgias, neck pain, numbness, visual change or weakness. She has tried rest for the symptoms. The treatment provided significant relief.    Nicole Jensen is a 66 y/o woman presenting for follow up evaluation of chronic conditions. Last year she saw Dr. Venia Minks and was found to have prediabetes based on A1C of 6.0 and also hypercholesterolemia. She has not been back to assess this since 09/01/2016. POCT A1C today is 6.0, stable from last time. 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 She drinks soft drinks and snacks while she is driving. Has thought about bariatric surgery in the past.  Patient had episode of dizziness on the bus yesterday which resolved with rest. She denied room spinning, heart palpitations, worsening with head movements, chest pain. Just reports she felt lightheaded and then it resolved on its own.  She did come in a couple of days ago for left shoulder pain. She was worried that it is 2/2 arterial blockage because a friend told her they had something similar.   No Known Allergies   Current Outpatient Prescriptions:  .  aspirin 81 MG tablet, Take 81 mg by mouth daily., Disp: , Rfl:  .  Cholecalciferol (VITAMIN D3) 5000 UNITS TABS, Take by mouth., Disp: , Rfl:  .  fluticasone (FLONASE) 50 MCG/ACT nasal spray, Place 2 sprays into both nostrils daily., Disp: 16 g, Rfl: 3 .  GARLIC PO, Take by mouth., Disp:  , Rfl:  .  ibuprofen (ADVIL,MOTRIN) 200 MG tablet, Take 200 mg by mouth every 6 (six) hours as needed., Disp: , Rfl:   Review of Systems  Constitutional: Negative.  Negative for chills, diaphoresis, fatigue and fever.  HENT: Negative for congestion.   Respiratory: Negative.  Negative for cough.   Cardiovascular: Negative.  Negative for chest pain.  Gastrointestinal: Negative.  Negative for abdominal pain.  Endocrine: Negative.   Musculoskeletal: Positive for arthralgias. Negative for back pain, gait problem, joint swelling, myalgias, neck pain and neck stiffness.  Neurological: Positive for dizziness. Negative for tremors, seizures, syncope, facial asymmetry, speech difficulty, weakness, light-headedness, numbness and headaches.  Hematological: Does not bruise/bleed easily.    Social History  Substance Use Topics  . Smoking status: Never Smoker  . Smokeless tobacco: Never Used  . Alcohol use No   Objective:   BP 122/82 (BP Location: Right Arm, Patient Position: Sitting, Cuff Size: Large)   Pulse 64   Temp 97.6 F (36.4 C) (Oral)   Resp 16   Wt 245 lb (111.1 kg)   LMP  (LMP Unknown)   BMI 42.05 kg/m  Vitals:   11/02/16 0909  BP: 122/82  Pulse: 64  Resp: 16  Temp: 97.6 F (36.4 C)  TempSrc: Oral  Weight: 245 lb (111.1 kg)     Physical Exam  Constitutional: She is oriented to person, place, and time. She appears well-developed and well-nourished.  Eyes: EOM are normal.  Cardiovascular:  Normal rate, regular rhythm, normal heart sounds and intact distal pulses.  Exam reveals no gallop and no friction rub.   No murmur heard. Pulmonary/Chest: Effort normal and breath sounds normal.  Neurological: She is alert and oriented to person, place, and time. No cranial nerve deficit.  Skin: Skin is warm and dry.  Psychiatric: She has a normal mood and affect.        Assessment & Plan:     1. Abnormal blood sugar  A1C today is 6.0, stable from last year. Recommend 30 min  walking 5x/week and eliminating soft drinks to start. Labs as below. Will see her at her physical in about one month.   - POCT glycosylated hemoglobin (Hb A1C) - Comprehensive metabolic panel - Lipid panel - CBC with Differential/Platelet  2. Dizziness  EKG in office today was normal. Offered referral to cardiology, pt prefers to wait.  - EKG 12-Lead - Comprehensive metabolic panel - CBC with Differential/Platelet  3. Hypercholesterolemia  Lipid panel.  4. Prediabetes  Diet/exercise modification. Recheck at future visits.  5. Chronic left shoulder pain  - DG Shoulder Left; Future  I have spent 25 minutes with this patient, >50% of which was spent on counseling and coordination of care.   The entirety of the information documented in the History of Present Illness, Review of Systems and Physical Exam were personally obtained by me. Portions of this information were initially documented by Ashley Royalty, CMA and reviewed by me for thoroughness and accuracy.        Trinna Post, PA-C  [facility]

## 2016-11-02 NOTE — Patient Instructions (Addendum)
Head down to get labs at [facility] Then across the street to outpatient imaging center at [facility] Please walk 30 min daily five times per week, try to reduce sugar intake by avoiding sodas to start off with     Health Maintenance, Female Adopting a healthy lifestyle and getting preventive care can go a long way to promote health and wellness. Talk with your health care provider about what schedule of regular examinations is right for you. This is a good chance for you to check in with your provider about disease prevention and staying healthy. In between checkups, there are plenty of things you can do on your own. Experts have done a lot of research about which lifestyle changes and preventive measures are most likely to keep you healthy. Ask your health care provider for more information. Weight and diet Eat a healthy diet  Be sure to include plenty of vegetables, fruits, low-fat dairy products, and lean protein.  Do not eat a lot of foods high in solid fats, added sugars, or salt.  Get regular exercise. This is one of the most important things you can do for your health.  Most adults should exercise for at least 150 minutes each week. The exercise should increase your heart rate and make you sweat (moderate-intensity exercise).  Most adults should also do strengthening exercises at least twice a week. This is in addition to the moderate-intensity exercise. Maintain a healthy weight  Body mass index (BMI) is a measurement that can be used to identify possible weight problems. It estimates body fat based on height and weight. Your health care provider can help determine your BMI and help you achieve or maintain a healthy weight.  For females 29 years of age and older:  A BMI below 18.5 is considered underweight.  A BMI of 18.5 to 24.9 is normal.  A BMI of 25 to 29.9 is considered overweight.  A BMI of 30 and above is considered obese. Watch levels of cholesterol and blood  lipids  You should start having your blood tested for lipids and cholesterol at 65 years of age, then have this test every 5 years.  You may need to have your cholesterol levels checked more often if:  Your lipid or cholesterol levels are high.  You are older than 65 years of age.  You are at high risk for heart disease. Cancer screening Lung Cancer  Lung cancer screening is recommended for adults 70-40 years old who are at high risk for lung cancer because of a history of smoking.  A yearly low-dose CT scan of the lungs is recommended for people who:  Currently smoke.  Have quit within the past 15 years.  Have at least a 30-pack-year history of smoking. A pack year is smoking an average of one pack of cigarettes a day for 1 year.  Yearly screening should continue until it has been 15 years since you quit.  Yearly screening should stop if you develop a health problem that would prevent you from having lung cancer treatment. Breast Cancer  Practice breast self-awareness. This means understanding how your breasts normally appear and feel.  It also means doing regular breast self-exams. Let your health care provider know about any changes, no matter how small.  If you are in your 20s or 30s, you should have a clinical breast exam (CBE) by a health care provider every 1-3 years as part of a regular health exam.  If you are 32 or older, have a  CBE every year. Also consider having a breast X-ray (mammogram) every year.  If you have a family history of breast cancer, talk to your health care provider about genetic screening.  If you are at high risk for breast cancer, talk to your health care provider about having an MRI and a mammogram every year.  Breast cancer gene (BRCA) assessment is recommended for women who have family members with BRCA-related cancers. BRCA-related cancers include:  Breast.  Ovarian.  Tubal.  Peritoneal cancers.  Results of the assessment will  determine the need for genetic counseling and BRCA1 and BRCA2 testing. Cervical Cancer  Your health care provider may recommend that you be screened regularly for cancer of the pelvic organs (ovaries, uterus, and vagina). This screening involves a pelvic examination, including checking for microscopic changes to the surface of your cervix (Pap test). You may be encouraged to have this screening done every 3 years, beginning at age 34.  For women ages 55-65, health care providers may recommend pelvic exams and Pap testing every 3 years, or they may recommend the Pap and pelvic exam, combined with testing for human papilloma virus (HPV), every 5 years. Some types of HPV increase your risk of cervical cancer. Testing for HPV may also be done on women of any age with unclear Pap test results.  Other health care providers may not recommend any screening for nonpregnant women who are considered low risk for pelvic cancer and who do not have symptoms. Ask your health care provider if a screening pelvic exam is right for you.  If you have had past treatment for cervical cancer or a condition that could lead to cancer, you need Pap tests and screening for cancer for at least 20 years after your treatment. If Pap tests have been discontinued, your risk factors (such as having a new sexual partner) need to be reassessed to determine if screening should resume. Some women have medical problems that increase the chance of getting cervical cancer. In these cases, your health care provider may recommend more frequent screening and Pap tests. Colorectal Cancer  This type of cancer can be detected and often prevented.  Routine colorectal cancer screening usually begins at 65 years of age and continues through 65 years of age.  Your health care provider may recommend screening at an earlier age if you have risk factors for colon cancer.  Your health care provider may also recommend using home test kits to check for  hidden blood in the stool.  A small camera at the end of a tube can be used to examine your colon directly (sigmoidoscopy or colonoscopy). This is done to check for the earliest forms of colorectal cancer.  Routine screening usually begins at age 43.  Direct examination of the colon should be repeated every 5-10 years through 65 years of age. However, you may need to be screened more often if early forms of precancerous polyps or small growths are found. Skin Cancer  Check your skin from head to toe regularly.  Tell your health care provider about any new moles or changes in moles, especially if there is a change in a mole's shape or color.  Also tell your health care provider if you have a mole that is larger than the size of a pencil eraser.  Always use sunscreen. Apply sunscreen liberally and repeatedly throughout the day.  Protect yourself by wearing long sleeves, pants, a wide-brimmed hat, and sunglasses whenever you are outside. Heart disease, diabetes, and high  blood pressure  High blood pressure causes heart disease and increases the risk of stroke. High blood pressure is more likely to develop in:  People who have blood pressure in the high end of the normal range (130-139/85-89 mm Hg).  People who are overweight or obese.  People who are African American.  If you are 23-27 years of age, have your blood pressure checked every 3-5 years. If you are 62 years of age or older, have your blood pressure checked every year. You should have your blood pressure measured twice-once when you are at a hospital or clinic, and once when you are not at a hospital or clinic. Record the average of the two measurements. To check your blood pressure when you are not at a hospital or clinic, you can use:  An automated blood pressure machine at a pharmacy.  A home blood pressure monitor.  If you are between 27 years and 34 years old, ask your health care provider if you should take aspirin to  prevent strokes.  Have regular diabetes screenings. This involves taking a blood sample to check your fasting blood sugar level.  If you are at a normal weight and have a low risk for diabetes, have this test once every three years after 65 years of age.  If you are overweight and have a high risk for diabetes, consider being tested at a younger age or more often. Preventing infection Hepatitis B  If you have a higher risk for hepatitis B, you should be screened for this virus. You are considered at high risk for hepatitis B if:  You were born in a country where hepatitis B is common. Ask your health care provider which countries are considered high risk.  Your parents were born in a high-risk country, and you have not been immunized against hepatitis B (hepatitis B vaccine).  You have HIV or AIDS.  You use needles to inject street drugs.  You live with someone who has hepatitis B.  You have had sex with someone who has hepatitis B.  You get hemodialysis treatment.  You take certain medicines for conditions, including cancer, organ transplantation, and autoimmune conditions. Hepatitis C  Blood testing is recommended for:  Everyone born from [birth year] through 1964.  Anyone with known risk factors for hepatitis C. Sexually transmitted infections (STIs)  You should be screened for sexually transmitted infections (STIs) including gonorrhea and chlamydia if:  You are sexually active and are younger than 65 years of age.  You are older than 65 years of age and your health care provider tells you that you are at risk for this type of infection.  Your sexual activity has changed since you were last screened and you are at an increased risk for chlamydia or gonorrhea. Ask your health care provider if you are at risk.  If you do not have HIV, but are at risk, it may be recommended that you take a prescription medicine daily to prevent HIV infection. This is called pre-exposure  prophylaxis (PrEP). You are considered at risk if:  You are sexually active and do not regularly use condoms or know the HIV status of your partner(s).  You take drugs by injection.  You are sexually active with a partner who has HIV. Talk with your health care provider about whether you are at high risk of being infected with HIV. If you choose to begin PrEP, you should first be tested for HIV. You should then be tested every 3 months  for as long as you are taking PrEP. Pregnancy  If you are premenopausal and you may become pregnant, ask your health care provider about preconception counseling.  If you may become pregnant, take 400 to 800 micrograms (mcg) of folic acid every day.  If you want to prevent pregnancy, talk to your health care provider about birth control (contraception). Osteoporosis and menopause  Osteoporosis is a disease in which the bones lose minerals and strength with aging. This can result in serious bone fractures. Your risk for osteoporosis can be identified using a bone density scan.  If you are 3 years of age or older, or if you are at risk for osteoporosis and fractures, ask your health care provider if you should be screened.  Ask your health care provider whether you should take a calcium or vitamin D supplement to lower your risk for osteoporosis.  Menopause may have certain physical symptoms and risks.  Hormone replacement therapy may reduce some of these symptoms and risks. Talk to your health care provider about whether hormone replacement therapy is right for you. Follow these instructions at home:  Schedule regular health, dental, and eye exams.  Stay current with your immunizations.  Do not use any tobacco products including cigarettes, chewing tobacco, or electronic cigarettes.  If you are pregnant, do not drink alcohol.  If you are breastfeeding, limit how much and how often you drink alcohol.  Limit alcohol intake to no more than 1 drink  per day for nonpregnant women. One drink equals 12 ounces of beer, 5 ounces of wine, or 1 ounces of hard liquor.  Do not use street drugs.  Do not share needles.  Ask your health care provider for help if you need support or information about quitting drugs.  Tell your health care provider if you often feel depressed.  Tell your health care provider if you have ever been abused or do not feel safe at home. This information is not intended to replace advice given to you by your health care provider. Make sure you discuss any questions you have with your health care provider. Document Released: 01/15/2011 Document Revised: 12/08/2015 Document Reviewed: 04/05/2015 Elsevier Interactive Patient Education  2017 [employer].

## 2016-11-03 LAB — CBC WITH DIFFERENTIAL/PLATELET
Basophils Absolute: 0 10*3/uL (ref 0.0–0.2)
Basos: 0 %
EOS (ABSOLUTE): 0.1 10*3/uL (ref 0.0–0.4)
Eos: 2 %
Hematocrit: 37.3 % (ref 34.0–46.6)
Hemoglobin: 12.7 g/dL (ref 11.1–15.9)
Immature Grans (Abs): 0 10*3/uL (ref 0.0–0.1)
Immature Granulocytes: 0 %
Lymphocytes Absolute: 1.8 10*3/uL (ref 0.7–3.1)
Lymphs: 41 %
MCH: 26.8 pg (ref 26.6–33.0)
MCHC: 34 g/dL (ref 31.5–35.7)
MCV: 79 fL (ref 79–97)
Monocytes Absolute: 0.3 10*3/uL (ref 0.1–0.9)
Monocytes: 7 %
Neutrophils Absolute: 2.1 10*3/uL (ref 1.4–7.0)
Neutrophils: 50 %
Platelets: 217 10*3/uL (ref 150–379)
RBC: 4.73 x10E6/uL (ref 3.77–5.28)
RDW: 15.4 % (ref 12.3–15.4)
WBC: 4.3 10*3/uL (ref 3.4–10.8)

## 2016-11-03 LAB — LIPID PANEL
Chol/HDL Ratio: 3 ratio (ref 0.0–4.4)
Cholesterol, Total: 216 mg/dL — ABNORMAL HIGH (ref 100–199)
HDL: 72 mg/dL (ref >39)
LDL Calculated: 131 mg/dL — ABNORMAL HIGH (ref 0–99)
Triglycerides: 65 mg/dL (ref 0–149)
VLDL Cholesterol Cal: 13 mg/dL (ref 5–40)

## 2016-11-03 LAB — COMPREHENSIVE METABOLIC PANEL
ALT: 13 IU/L (ref 0–32)
AST: 19 IU/L (ref 0–40)
Albumin/Globulin Ratio: 1.1 — ABNORMAL LOW (ref 1.2–2.2)
Albumin: 4 g/dL (ref 3.6–4.8)
Alkaline Phosphatase: 96 IU/L (ref 39–117)
BUN/Creatinine Ratio: 15 (ref 12–28)
BUN: 11 mg/dL (ref 8–27)
Bilirubin Total: 0.3 mg/dL (ref 0.0–1.2)
CO2: 24 mmol/L (ref 18–29)
Calcium: 9.4 mg/dL (ref 8.7–10.3)
Chloride: 104 mmol/L (ref 96–106)
Creatinine, Ser: 0.75 mg/dL (ref 0.57–1.00)
GFR calc Af Amer: 97 mL/min/{1.73_m2} (ref >59)
GFR calc non Af Amer: 85 mL/min/{1.73_m2} (ref >59)
Globulin, Total: 3.6 g/dL (ref 1.5–4.5)
Glucose: 87 mg/dL (ref 65–99)
Potassium: 4.3 mmol/L (ref 3.5–5.2)
Sodium: 144 mmol/L (ref 134–144)
Total Protein: 7.6 g/dL (ref 6.0–8.5)

## 2016-11-07 ENCOUNTER — Telehealth: Payer: Self-pay

## 2016-11-07 NOTE — Telephone Encounter (Signed)
-----   Message from Trinna Post, [place] sent at 11/07/2016  8:42 AM EDT ----- Labs are stable. Cholesterol is slightly elevated. But 10 yr risk for cardiovascular event is 6.4%, so no statin indicated yet. Really need to work on lifestyle changes we talked about, good start would be to reduce sugary drinks and snacks.

## 2016-11-07 NOTE — Telephone Encounter (Signed)
LMTCB 11/07/2016  Thanks,   -Mickel Baas

## 2016-11-07 NOTE — Telephone Encounter (Signed)
Pt advised.   Thanks,   -Neriah Brott  

## 2016-11-13 ENCOUNTER — Ambulatory Visit: Payer: BC Managed Care – PPO | Attending: Physician Assistant

## 2016-11-13 DIAGNOSIS — M25512 Pain in left shoulder: Secondary | ICD-10-CM | POA: Insufficient documentation

## 2016-11-13 DIAGNOSIS — G8929 Other chronic pain: Secondary | ICD-10-CM | POA: Diagnosis present

## 2016-11-13 DIAGNOSIS — M25612 Stiffness of left shoulder, not elsewhere classified: Secondary | ICD-10-CM | POA: Diagnosis present

## 2016-11-13 NOTE — Therapy (Signed)
[facility] [address] [place], [address] Phone: [phone]   Fax:  [phone]  Physical Therapy Evaluation  Patient Details  Name: Nicole Jensen MRN: [id number] Date of Birth: 1950/07/25 Referring Provider: Trinna Post, PA-C  Encounter Date: 11/13/2016      PT End of Session - 11/13/16 0953    Visit Number 1   Number of Visits 13   Date for PT Re-Evaluation 12/27/16   PT Start Time 0953   PT Stop Time 1106   PT Time Calculation (min) 73 min   Activity Tolerance Patient tolerated treatment well   Behavior During Therapy [facility] for tasks assessed/performed      Past Medical History:  Diagnosis Date  . Arthritis   . Leg cramps    right leg    Past Surgical History:  Procedure Laterality Date  . ABDOMINAL HYSTERECTOMY     partial. pt reports she has cervix    There were no vitals filed for this visit.       Subjective Assessment - 11/13/16 0958    Subjective L shoulder pain. 3/10 currently (shoulder at resting position), 1/10 at best (pt had a day off), 8/10 at worst (at the end of the work day)   Pertinent History L shoulder pain (adhesive capsulitis). Sudden onset of pain 3-4 months ago, unknown method of injury.  Pt states that both shoulders were hurting. R shoulder does not bother her anymore and can reach with it. Her L shoulder still bothers her.  Has not yet had imaging for her L shouder. The doctor said that her pain migh be due to her rotator cuff. Pt is R hand dominant. Pt states gaining weight, about 25-30 lbs during the winter (lost 25 lbs 3-4 months ago then gained it back up again, pt states eating junk food).  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  Has a difficulty lifting heavy buckets of water, turning the stearing wheel. Pain is improving. Still has difficulty lifting her L arm up.  Pain wakes her up at night if  she lays onto her L side.    Patient Stated Goals I want to be able to lift it (L arm)   Currently in Pain? Yes   Pain Score 3   4/10 when raising her L arm up   Pain Location Shoulder   Pain Orientation Left   Pain Descriptors / Indicators Dull;Aching  pinching   Pain Type Chronic pain   Pain Onset More than a month ago   Pain Frequency Occasional   Aggravating Factors  lifting a heavy bucket of water, turning the steering wheel for the bus, raising her L arm.    Pain Relieving Factors Tylenol extra strength, bengay, coco butter, resting her L arm.             [facility] PT Assessment - 11/13/16 1014      Assessment   Medical Diagnosis L shoulder adhesive capsulitis   Referring Provider Trinna Post, PA-C   Onset Date/Surgical Date 07/16/16  3-4 months ago, no specific date provided   Hand Dominance Right   Prior Therapy No known PT for current condition     Precautions   Precaution Comments No known precautions     Restrictions   Other Position/Activity Restrictions No known restrictions     Balance Screen   Has the patient  fallen in the past 6 months Yes   How many times? 1  Pt slipped on a slippery floor 2 months ago   Has the patient had a decrease in activity level because of a fear of falling?  No   Is the patient reluctant to leave their home because of a fear of falling?  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      Observation/Other Assessments   Observations (+) Neer's and yocum tests with reproduction of L shoulder pain. (+) Neer's impingement but diffierent shoulder pain produced.  (-) empty can test L shoulder (4+/5 strength).   Tense, muscle guarding. No pain with supine manually resisted ER with distraction and compression at L shoulder   Quick DASH  22.72%     Posture/Postural Control   Posture Comments bilaterally protracted shoulders and neck, R lateral shift, movement crease around C5/C6 area     AROM   Overall AROM Comments no reproduction of symptoms with cervical AROM all planes and with over pressure.    Right Shoulder Flexion 144 Degrees   Right Shoulder ABduction 134 Degrees   Left Shoulder Flexion 74 Degrees   Left Shoulder ABduction 77 Degrees   Left Shoulder Internal Rotation 66 Degrees  at 60 degrees abduction; AAROM 72 degrees supine   Left Shoulder External Rotation 63 Degrees  AAROM 82 degrees supine     PROM   Overall PROM Comments L shoulder flexion 138 degrees, abduction 135 degrees in supine     Strength   Right Shoulder Flexion 4+/5   Right Shoulder ABduction 4+/5   Right Shoulder Internal Rotation 4+/5   Right Shoulder External Rotation 4/5   Left Shoulder Flexion 4/5   Left Shoulder ABduction 5/5  at available range   Left Shoulder Internal Rotation 5/5   Left Shoulder External Rotation 4-/5     Palpation   Palpation comment Decreased inferior, posterior L shoulder joint mobility     Objectives  Manual therapy  Supine posterior, posterior inferior glide to L shoulder joint with L arm in abduction grade 3   L shoulder flexion AROM improved to 84 degrees. L shoulder abduction still at around 76 degrees    There-ex  standing bilateral shoulder ER resisting yellow band 10x3  Impoved L shoulder flexion AROM to 90 degrees.  Improved exercise technique, movement at target joints, use of target muscles after mod verbal, visual, tactile cues.                   PT Education - 11/13/16 1059    Education provided Yes   Education Details ther-ex, HEP, plan of care   Person(s) Educated Patient   Methods Explanation;Demonstration;Tactile cues;Verbal cues;Handout    Comprehension Returned demonstration;Verbalized understanding             PT Long Term Goals - 11/13/16 1244      PT LONG TERM GOAL #1   Title Patient will have a decrease in L shoulder pain to 4/10 or less at worst to promote ability to raise her arm, carry a bucket of water, turn the  steering wheel.    Baseline 8/10 L shoulder pain at worst (11/13/2016)   Time 6   Period Weeks   Status New     PT LONG TERM GOAL #2   Title Patient will improve her Quick Dash Disability/Symptom Score by at least 15% as a demonsration of improved function.    Baseline 22.72% (11/13/2016)   Time 6   Period Weeks   Status New     PT LONG TERM GOAL #3   Title Patient will improve her L shoulder flexion AROM to at least 110 degrees and abduction AROM to at least 110 degrees to promote ability to raise her L arm, reach.    Baseline L shoulder AROM: 74 degrees flexion, 77 degrees abduction (11/13/2016)   Time 6   Period Weeks   Status New     PT LONG TERM GOAL #4   Title Patient will improve her L shoulder ER muscle strength by at least 1/2 MMT grade to promote ability to raise her L arm with less pain.    Baseline L shoulder ER  4-/5 (11/13/2016)   Time 6   Period Weeks   Status New               Plan - 11/13/16 1111    Clinical Impression Statement Patient is a 65 year old female who came to physical therapy secondary to L shoulder pain since 3-4 months ago. She also presents with limited ROM, posterior and inferior L glenohumeral joint stiffness, L infraspinatus muscle weakness, difficulty with scapular control, muscle guarding, poor posture, and difficulty performing functional tasks such as reaching, turning the steering wheel, and carrying a bucket of water with her L arm. Patient will benefit from skilled physical therapy services to address the aforementioned deficits.    Rehab Potential Good   Clinical Impairments Affecting Rehab Potential chronicity of condition   PT Frequency 2x /  week   PT Duration 6 weeks   PT Treatment/Interventions Therapeutic exercise;Therapeutic activities;Manual techniques;Dry needling;Aquatic Therapy;Ultrasound;Iontophoresis 4mg /ml Dexamethasone;Electrical Stimulation;Neuromuscular re-education;Patient/family education   Consulted and Agree with Plan of Care Patient      Patient will benefit from skilled therapeutic intervention in order to improve the following deficits and impairments:  Pain, Postural dysfunction, Improper body mechanics, Decreased strength, Decreased range of motion  Visit Diagnosis: Chronic left shoulder pain - Plan: PT plan of care cert/re-cert  Stiffness of left shoulder, not elsewhere classified - Plan: PT plan of care cert/re-cert     Problem List Patient Active Problem List   Diagnosis Date Noted  . Prediabetes 11/02/2016  . Abnormal blood sugar 08/24/2015  . Allergic rhinitis 05/27/2015  . Leiomyoma of uterus 05/27/2015  . Hypercholesterolemia 05/27/2015  . Anterior optic neuritis 05/27/2015  . Avitaminosis D 05/27/2015    Joneen Boers PT, DPT   11/13/2016, 2:30 PM  [facility] [facility] [facility] [address] [place], [address] Phone: [phone]   Fax:  [phone]  Name: Nicole Jensen MRN: [id number] Date of Birth: 09/08/1950

## 2016-11-13 NOTE — Patient Instructions (Signed)
Resisted External Rotation: in Neutral - Bilateral    Sit or stand, tubing in both hands, elbows at sides, bent to 90, forearms forward. Pinch shoulder blades together and rotate forearms out. Keep elbows at sides. Repeat _10___ times per set. Do _1___ sets per session. Do __3__ sessions per day.  http://orth.exer.us/966   Copyright  VHI. All rights reserved.

## 2016-11-15 ENCOUNTER — Telehealth: Payer: Self-pay

## 2016-11-15 ENCOUNTER — Ambulatory Visit: Payer: BC Managed Care – PPO

## 2016-11-15 NOTE — Telephone Encounter (Signed)
No show. Called patient pertaining to today's appointment, and a reminder for her next follow up session. Return phone call requested. Phone number [phone]) provided.

## 2016-12-04 ENCOUNTER — Ambulatory Visit: Payer: BC Managed Care – PPO

## 2016-12-04 DIAGNOSIS — M25512 Pain in left shoulder: Principal | ICD-10-CM

## 2016-12-04 DIAGNOSIS — M25612 Stiffness of left shoulder, not elsewhere classified: Secondary | ICD-10-CM

## 2016-12-04 DIAGNOSIS — G8929 Other chronic pain: Secondary | ICD-10-CM

## 2016-12-04 NOTE — Therapy (Signed)
[facility] [address], [place], [address] Phone: [phone]   Fax:  [phone]  Physical Therapy Treatment  Patient Details  Name: Nicole Jensen MRN: [id number] Date of Birth: Jan 04, 1951 Referring Provider: Trinna Post, PA-C  Encounter Date: 12/04/2016      PT End of Session - 12/04/16 0950    Visit Number 2   Number of Visits 13   Date for PT Re-Evaluation 12/27/16   PT Start Time 0950   PT Stop Time 1032   PT Time Calculation (min) 42 min   Activity Tolerance Patient tolerated treatment well   Behavior During Therapy [facility] for tasks assessed/performed      Past Medical History:  Diagnosis Date  . Arthritis   . Leg cramps    right leg    Past Surgical History:  Procedure Laterality Date  . ABDOMINAL HYSTERECTOMY     partial. pt reports she has cervix    There were no vitals filed for this visit.      Subjective Assessment - 12/04/16 0951    Subjective Not in as much pain as I did. The band exercise helps. Still has a hard time raising her L arm. No pain at rest.  5/10 when raising her L arm up to the side.    Pertinent History L shoulder pain (adhesive capsulitis). Sudden onset of pain 3-4 months ago, unknown method of injury.  Pt states that both shoulders were hurting. R shoulder does not bother her anymore and can reach with it. Her L shoulder still bothers her.  Has not yet had imaging for her L shouder. The doctor said that her pain migh be due to her rotator cuff. Pt is R hand dominant. Pt states gaining weight, about 25-30 lbs during the winter (lost 25 lbs 3-4 months ago then gained it back up again, pt states eating junk food).  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  Has a difficulty lifting heavy buckets of water, turning the stearing wheel. Pain is improving. Still has difficulty lifting her L arm up.  Pain wakes her  up at night if she lays onto her L side.    Patient Stated Goals I want to be able to lift it (L arm)   Currently in Pain? Yes   Pain Score 5   when raising her L arm up.    Pain Onset More than a month ago                                 PT Education - 12/04/16 1028    Education provided Yes   Education Details ther-ex   [employer]) Educated Patient   Methods Explanation;Demonstration;Tactile cues;Verbal cues   Comprehension Returned demonstration;Verbalized understanding        Objectives   Manual therapy   Seated A to P to L shoulder grade 3   Decreased pain when rasisng her L arm to the side   Caudal glide to L distal clavicle grade 3 to decrease stiffness  Decreased pain with L shoulder scaption   R S/L L scapular mobilization   Decreased pain with L shoulder scaptinon  Inferior glide to L shoulder joint with arm in about 70 degrees abduction grade 3  Decreased pain with abduction    There-ex  Supine L shoulder  abduciton AAROM with PT assist 10x3 to promote ROM Supine L shoulder flexion AAROM with PT assist to end range 5x. Discomfort at end range  Supine bilateral shoulder ER to neutral resisting yellow band with simultaneous shoulder flexion to 90 degrees 10x2   L shoulder AROM  Flexion 88 degrees  Abduction 76 degrees  Improved exercise technique, movement at target joints, use of target muscles after mod verbal, visual, tactile cues.   Decreased L shoulder pain with abduction follpowing manual therapy to promote shoulder joint mobility, AC joint, and scapular mobility.        PT Long Term Goals - 11/13/16 1244      PT LONG TERM GOAL #1   Title Patient will have a decrease in L shoulder pain to 4/10 or less at worst to promote ability to raise her arm, carry a bucket of water, turn the steering wheel.    Baseline 8/10 L shoulder pain at worst (11/13/2016)   Time 6   Period Weeks   Status New     PT LONG TERM GOAL #2   Title  Patient will improve her Quick Dash Disability/Symptom Score by at least 15% as a demonsration of improved function.    Baseline 22.72% (11/13/2016)   Time 6   Period Weeks   Status New     PT LONG TERM GOAL #3   Title Patient will improve her L shoulder flexion AROM to at least 110 degrees and abduction AROM to at least 110 degrees to promote ability to raise her L arm, reach.    Baseline L shoulder AROM: 74 degrees flexion, 77 degrees abduction (11/13/2016)   Time 6   Period Weeks   Status New     PT LONG TERM GOAL #4   Title Patient will improve her L shoulder ER muscle strength by at least 1/2 MMT grade to promote ability to raise her L arm with less pain.    Baseline L shoulder ER  4-/5 (11/13/2016)   Time 6   Period Weeks   Status New               Plan - 12/04/16 1028    Clinical Impression Statement Decreased L shoulder pain with abduction follpowing manual therapy to promote shoulder joint mobility, AC joint, and scapular mobility.    Rehab Potential Good   Clinical Impairments Affecting Rehab Potential chronicity of condition   PT Frequency 2x / week   PT Duration 6 weeks   PT Treatment/Interventions Therapeutic exercise;Therapeutic activities;Manual techniques;Dry needling;Aquatic Therapy;Ultrasound;Iontophoresis 4mg /ml Dexamethasone;Electrical Stimulation;Neuromuscular re-education;Patient/family education   Consulted and Agree with Plan of Care Patient      Patient will benefit from skilled therapeutic intervention in order to improve the following deficits and impairments:  Pain, Postural dysfunction, Improper body mechanics, Decreased strength, Decreased range of motion  Visit Diagnosis: Chronic left shoulder pain  Stiffness of left shoulder, not elsewhere classified     Problem List Patient Active Problem List   Diagnosis Date Noted  . Prediabetes 11/02/2016  . Abnormal blood sugar 08/24/2015  . Allergic rhinitis 05/27/2015  . Leiomyoma of uterus  05/27/2015  . Hypercholesterolemia 05/27/2015  . Anterior optic neuritis 05/27/2015  . Avitaminosis D 05/27/2015    Joneen Boers PT, DPT   12/04/2016, 7:54 PM  [facility] [address], [place], [address] Phone: [phone]   Fax:  [phone]  Name: Nicole Jensen MRN: [id number] Date of Birth: 1950-07-19

## 2016-12-06 ENCOUNTER — Ambulatory Visit: Payer: BC Managed Care – PPO

## 2016-12-06 DIAGNOSIS — G8929 Other chronic pain: Secondary | ICD-10-CM

## 2016-12-06 DIAGNOSIS — M25512 Pain in left shoulder: Principal | ICD-10-CM

## 2016-12-06 DIAGNOSIS — M25612 Stiffness of left shoulder, not elsewhere classified: Secondary | ICD-10-CM

## 2016-12-06 NOTE — Therapy (Signed)
[facility] [address] [place], [address] Phone: [phone]   Fax:  [phone]  Physical Therapy Treatment  Patient Details  Name: NICHOLETTE DOLSON MRN: [id number] Date of Birth: 04-Sep-1950 Referring Provider: Trinna Post, PA-C  Encounter Date: 12/06/2016      PT End of Session - 12/06/16 0943    Visit Number 3   Number of Visits 13   Date for PT Re-Evaluation 12/27/16   PT Start Time 0943   PT Stop Time 1032   PT Time Calculation (min) 49 min   Activity Tolerance Patient tolerated treatment well   Behavior During Therapy [facility] for tasks assessed/performed      Past Medical History:  Diagnosis Date  . Arthritis   . Leg cramps    right leg    Past Surgical History:  Procedure Laterality Date  . ABDOMINAL HYSTERECTOMY     partial. pt reports she has cervix    There were no vitals filed for this visit.      Subjective Assessment - 12/06/16 0944    Subjective L shoulder is a little better. Tries to reach back and to the side and its better. No pain currently. Feels stiff when raising her L arm up.    Pertinent History L shoulder pain (adhesive capsulitis). Sudden onset of pain 3-4 months ago, unknown method of injury.  Pt states that both shoulders were hurting. R shoulder does not bother her anymore and can reach with it. Her L shoulder still bothers her.  Has not yet had imaging for her L shouder. The doctor said that her pain migh be due to her rotator cuff. Pt is R hand dominant. Pt states gaining weight, about 25-30 lbs during the winter (lost 25 lbs 3-4 months ago then gained it back up again, pt states eating junk food).  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  Has a difficulty lifting heavy buckets of water, turning the stearing wheel. Pain is improving. Still has difficulty lifting her L arm up.  Pain wakes her up at night if  she lays onto her L side.    Patient Stated Goals I want to be able to lift it (L arm)   Currently in Pain? No/denies   Pain Score 0-No pain   Pain Onset More than a month ago                                 PT Education - 12/06/16 1036    Education provided Yes   Education Details ther-ex, HEP   Person(s) Educated Patient   Methods Explanation;Demonstration;Tactile cues;Verbal cues   Comprehension Verbalized understanding;Returned demonstration        Objectives   L shoulder AROM at start of session: 94 degrees flexion/scaption, 78 degrees abduction at start of session   Manual therapy  Seated with L arm propped in about 70 degrees abduction: inferior, posterior inferior glide to L glenohumeral joint grade 3- to 3  98 degrees L shoulder flexion AROM  85 degrees L shoulder abduction AROM  Seated with L arm propped in about 70 degrees abduction:  STM to L distal pectoralis muscle  STM anterior deltoid   96 degrees flexion AROM  96 degrees abduction AROM afterwards  Seated with L arm propped in about 80 degrees abduction:  STM to L terres major   100 degrees L shoulder flexion AROM   93 degrees L shoulder abduction  There-ex  Supine L shoulder flexion AROM with tactile cues for scapular retraction. Pain free range 10x2  Decreased L anterior shoulder pain with end range   Supine L shoulder scaption AROM with tactile cues for scapular retraction.  Pain free range. 10x2   L shoulder AROM   94 L shoulder flexion when cued to not shrug shoulder blade and to improve scapular retraction   90 degrees L shoulder scaption when cued not to shrug shoulder and to improve scapular retraction.    Improved exercise technique, movement at target joints, use of target muscles after mod verbal, visual, tactile cues.           PT Long Term Goals - 11/13/16 1244      PT LONG TERM GOAL #1   Title Patient will have a decrease in L shoulder pain to  4/10 or less at worst to promote ability to raise her arm, carry a bucket of water, turn the steering wheel.    Baseline 8/10 L shoulder pain at worst (11/13/2016)   Time 6   Period Weeks   Status New     PT LONG TERM GOAL #2   Title Patient will improve her Quick Dash Disability/Symptom Score by at least 15% as a demonsration of improved function.    Baseline 22.72% (11/13/2016)   Time 6   Period Weeks   Status New     PT LONG TERM GOAL #3   Title Patient will improve her L shoulder flexion AROM to at least 110 degrees and abduction AROM to at least 110 degrees to promote ability to raise her L arm, reach.    Baseline L shoulder AROM: 74 degrees flexion, 77 degrees abduction (11/13/2016)   Time 6   Period Weeks   Status New     PT LONG TERM GOAL #4   Title Patient will improve her L shoulder ER muscle strength by at least 1/2 MMT grade to promote ability to raise her L arm with less pain.    Baseline L shoulder ER  4-/5 (11/13/2016)   Time 6   Period Weeks   Status New               Plan - 12/06/16 0941    Clinical Impression Statement Patient improved L shoulder flexion and abduction AROM overall after manual therapy to promote posterior and inferior glenohumeral joint mobility, STM to promote pectoralis, anterior deltoid and teres major muscle flexibility. Decreased pain with shoulder flexion and abduction after manual therapy with addition of scapular control. Patient making progress with ROM and ability to raise her L arm with less pain.    Rehab Potential Good   Clinical Impairments Affecting Rehab Potential chronicity of condition   PT Frequency 2x / week   PT Duration 6 weeks   PT Treatment/Interventions Therapeutic exercise;Therapeutic activities;Manual techniques;Dry needling;Aquatic Therapy;Ultrasound;Iontophoresis 4mg /ml Dexamethasone;Electrical Stimulation;Neuromuscular re-education;Patient/family education   Consulted and Agree with Plan of Care Patient       Patient will benefit from skilled therapeutic intervention in order to improve the following deficits and impairments:  Pain, Postural dysfunction, Improper body mechanics, Decreased strength, Decreased range of motion  Visit Diagnosis: Chronic left shoulder pain  Stiffness of left shoulder, not elsewhere classified     Problem List Patient Active Problem List   Diagnosis Date Noted  . Prediabetes 11/02/2016  . Abnormal blood sugar  08/24/2015  . Allergic rhinitis 05/27/2015  . Leiomyoma of uterus 05/27/2015  . Hypercholesterolemia 05/27/2015  . Anterior optic neuritis 05/27/2015  . Avitaminosis D 05/27/2015    Joneen Boers PT, DPT   12/06/2016, 10:43 AM  [facility] [address], [place], [address] Phone: [phone]   Fax:  [phone]  Name: ARLINA SABINA MRN: [id number] Date of Birth: [birth year]

## 2016-12-06 NOTE — Patient Instructions (Signed)
   On your back:   Bring your left arm up and back in a pain free range, keeping your shoulder blade squeezed.    Perform 10 times    3 sessions daily.        Your can also raise your arm up and back diagonally in a pain free range, keeping your shoulder blade squeezed.     Perform 10 times     3 sessions daily.

## 2016-12-17 ENCOUNTER — Telehealth: Payer: Self-pay

## 2016-12-17 ENCOUNTER — Ambulatory Visit: Payer: BC Managed Care – PPO | Attending: Physician Assistant

## 2016-12-17 DIAGNOSIS — G8929 Other chronic pain: Secondary | ICD-10-CM | POA: Insufficient documentation

## 2016-12-17 DIAGNOSIS — M25612 Stiffness of left shoulder, not elsewhere classified: Secondary | ICD-10-CM | POA: Insufficient documentation

## 2016-12-17 DIAGNOSIS — M25512 Pain in left shoulder: Secondary | ICD-10-CM | POA: Insufficient documentation

## 2016-12-17 NOTE — Telephone Encounter (Signed)
No show. Called patient and left a message pertaining to her scheduled appointment and a reminder for her next follow up session. Also asked pt if she would like to reschedule today's appointment. Return phone call requested. Phone number [phone]) provided.

## 2016-12-19 ENCOUNTER — Ambulatory Visit: Payer: BC Managed Care – PPO

## 2016-12-24 ENCOUNTER — Ambulatory Visit: Payer: BC Managed Care – PPO

## 2016-12-24 DIAGNOSIS — G8929 Other chronic pain: Secondary | ICD-10-CM

## 2016-12-24 DIAGNOSIS — M25612 Stiffness of left shoulder, not elsewhere classified: Secondary | ICD-10-CM

## 2016-12-24 DIAGNOSIS — M25512 Pain in left shoulder: Secondary | ICD-10-CM | POA: Diagnosis not present

## 2016-12-24 NOTE — Therapy (Signed)
[facility] [address], [place], [address] Phone: [phone]   Fax:  [phone]  Physical Therapy Treatment  Patient Details  Name: Nicole Jensen MRN: [id number] Date of Birth: 1950/11/04 Referring Provider: Trinna Post, PA-C  Encounter Date: 12/24/2016      PT End of Session - 12/24/16 0952    Visit Number 4   Number of Visits 21   Date for PT Re-Evaluation 01/24/17   PT Start Time 0952   PT Stop Time 1033   PT Time Calculation (min) 41 min   Activity Tolerance Patient tolerated treatment well   Behavior During Therapy [facility] for tasks assessed/performed      Past Medical History:  Diagnosis Date  . Arthritis   . Leg cramps    right leg    Past Surgical History:  Procedure Laterality Date  . ABDOMINAL HYSTERECTOMY     partial. pt reports she has cervix    There were no vitals filed for this visit.      Subjective Assessment - 12/24/16 0953    Subjective L shoulder is doing ok. Painfull every now and again, still a little stiff. Feels like she can reach further. Not keeping her from push-mowing the yard.  4/10 currently, and at worst for the past 7 days.    Pertinent History L shoulder pain (adhesive capsulitis). Sudden onset of pain 3-4 months ago, unknown method of injury.  Pt states that both shoulders were hurting. R shoulder does not bother her anymore and can reach with it. Her L shoulder still bothers her.  Has not yet had imaging for her L shouder. The doctor said that her pain migh be due to her rotator cuff. Pt is R hand dominant. Pt states gaining weight, about 25-30 lbs during the winter (lost 25 lbs 3-4 months ago then gained it back up again, pt states eating junk food).  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  Has a difficulty lifting heavy buckets of water, turning the stearing wheel. Pain is improving. Still has  difficulty lifting her L arm up.  Pain wakes her up at night if she lays onto her L side.    Patient Stated Goals I want to be able to lift it (L arm)   Currently in Pain? Yes   Pain Score 4    Pain Onset More than a month ago            [facility] PT Assessment - 12/24/16 0956      Observation/Other Assessments   Quick DASH  20.45%     AROM   Overall AROM Comments AROM flexion and scaption/abduction measured at start of session   Left Shoulder Flexion 85 Degrees   Left Shoulder ABduction 81 Degrees     Strength   Left Shoulder External Rotation 4/5                             PT Education - 12/24/16 1011    Education provided Yes   Education Details ther-ex   Person(s) Educated Patient   Methods Explanation;Demonstration;Tactile cues;Verbal cues   Comprehension Returned demonstration;Verbalized understanding        Objectives   L shoulder AROM at start of session: 85 degrees flexion/scaption, 81 degrees abduction at start of session    There-ex  Manually resisted  L shoulder ER 1-2x  Reviewed progress/current status with ER strength  L shoulder flexion and abduction AROM multiple times   Pt education on bed positioning and not to lie down on L side as well as to place pillows behind her if on her R side to keep her from rolling onto her L. Pt verbalized understanding.   Reviewed plan of care: 2x/week for 4 weeks  Bilateral shoulder ER resisting yellow band 10x2  Reviewed and given as part of her HEP. Pt demonstrated and verbalized understanding.   Improved exercise technique, movement at target joints, use of target muscles after min to mod verbal, visual, tactile cues.      Manual therapy  Seated with L arm propped in about 70 degrees abduction: inferior, posterior inferior glide to L glenohumeral joint grade 3- to 3. Stiff inferior glenohumeral joint.               Seated with L arm propped in about 70 degrees abduction:              STM to L distal pectoralis muscle             STM anterior deltoid6/05/2017  Seated with L arm propped in about 80 degrees abduction:             STM to L terres major              97 degrees L shoulder flexion AROM              85 degrees L shoulder abduction    Improved L shoulder flexion and abduction AROM following manual therapy to promote joint capsule mobility (inferior and posterior inferior), soft tissue mobilization to her distal pectoralis, anterior deltoid, and teres major muscles. Pt also demonstrates overall decreased L shoulder pain level and improved L shoulder flexion and abduction AROM since initial evaluation. Pt still demonstrates limited ROM, weakness, and difficulty performing functional tasks such as reaching, turning the steering wheel, and carrying a bucket of water for work (but improving) and would benefit from continued skilled physical therapy services to address the aforementioned deficits.        PT Long Term Goals - 12/24/16 1354      PT LONG TERM GOAL #1   Title Patient will have a decrease in L shoulder pain to 4/10 or less at worst to promote ability to raise her arm, carry a bucket of water, turn the steering wheel.    Baseline 8/10 L shoulder pain at worst (11/13/2016); 4/10 L shoulder pain at worst for the past 7 days (12/24/2016)   Time 6   Period Weeks   Status Achieved     PT LONG TERM GOAL #2   Title Patient will improve her Quick Dash Disability/Symptom Score by at least 15% as a demonsration of improved function.    Baseline 22.72% (11/13/2016); 20.45% (12/24/2016)   Time 4   Period Weeks   Status On-going     PT LONG TERM GOAL #3   Title Patient will improve her L shoulder flexion AROM to at least 110 degrees and abduction AROM to at least 110 degrees to promote ability to raise her L arm, reach.    Baseline L shoulder AROM: 74 degrees flexion, 77 degrees abduction (11/13/2016); 85 degrees flexion, 81 degrees abduction (12/24/2016)   Time  4   Period Weeks   Status Partially Met     PT LONG TERM GOAL #4   Title Patient will  improve her L shoulder ER muscle strength by at least 1/2 MMT grade to promote ability to raise her L arm with less pain.    Baseline L shoulder ER  4-/5 (11/13/2016); 4/5 (12/24/2016)   Time 6   Period Weeks   Status Achieved     PT LONG TERM GOAL #5   Title Patient will have a decrease in L shoulder pain to 2/10 or less at worst to promote ability to raise her arm, carry a bucket of water, turn the steering wheel.    Baseline 4/10 at worst for the past 7 days (12/24/2016)   Time 4   Period Weeks   Status New               Plan - 12/24/16 1016    Clinical Impression Statement Improved L shoulder flexion and abduction AROM following manual therapy to promote joint capsule mobility (inferior and posterior inferior), soft tissue mobilization to her distal pectoralis, anterior deltoid, and teres major muscles. Pt also demonstrates overall decreased L shoulder pain level and improved L shoulder flexion and abduction AROM since initial evaluation. Pt still demonstrates limited ROM, weakness, and difficulty performing functional tasks such as reaching, turning the steering wheel, and carrying a bucket of water for work (but improving) and would benefit from continued skilled physical therapy services to address the aforementioned deficits.    Clinical Presentation Stable   Clinical Presentation due to: improving with ROM and symptoms   Clinical Decision Making Low   Rehab Potential Good   Clinical Impairments Affecting Rehab Potential chronicity of condition   PT Frequency 2x / week   PT Duration 4 weeks   PT Treatment/Interventions Therapeutic exercise;Therapeutic activities;Manual techniques;Dry needling;Aquatic Therapy;Ultrasound;Iontophoresis 81m/ml Dexamethasone;Electrical Stimulation;Neuromuscular re-education;Patient/family education   Consulted and Agree with Plan of Care Patient      Patient  will benefit from skilled therapeutic intervention in order to improve the following deficits and impairments:  Pain, Postural dysfunction, Improper body mechanics, Decreased strength, Decreased range of motion  Visit Diagnosis: Chronic left shoulder pain - Plan: PT plan of care cert/re-cert  Stiffness of left shoulder, not elsewhere classified - Plan: PT plan of care cert/re-cert     Problem List Patient Active Problem List   Diagnosis Date Noted  . Prediabetes 11/02/2016  . Abnormal blood sugar 08/24/2015  . Allergic rhinitis 05/27/2015  . Leiomyoma of uterus 05/27/2015  . Hypercholesterolemia 05/27/2015  . Anterior optic neuritis 05/27/2015  . Avitaminosis D 05/27/2015   Thank you for your referral.  MJoneen BoersPT, DPT   12/24/2016, 2:12 PM  [facility] [address] [place] [address]Phone: [phone]  Fax:  [phone] Name: Nicole ORCHARDMRN: [id number] of Birth: 1November 28, 1952

## 2016-12-24 NOTE — Patient Instructions (Signed)
Resisted External Rotation: in Neutral - Bilateral    Sit or stand, tubing in both hands, elbows at sides, bent to 90, forearms forward. Pinch shoulder blades together and rotate forearms out. Keep elbows at sides. Repeat _10___ times per set. Do _1___ sets per session. Do __3__ sessions per day.  http://orth.exer.us/966   Copyright  VHI. All rights reserved.

## 2016-12-26 ENCOUNTER — Ambulatory Visit: Payer: BC Managed Care – PPO

## 2016-12-31 ENCOUNTER — Ambulatory Visit: Payer: BC Managed Care – PPO

## 2017-01-02 ENCOUNTER — Ambulatory Visit: Payer: BC Managed Care – PPO

## 2017-01-03 ENCOUNTER — Encounter: Payer: BC Managed Care – PPO | Admitting: Physician Assistant

## 2017-01-07 ENCOUNTER — Ambulatory Visit: Payer: BC Managed Care – PPO

## 2017-01-09 ENCOUNTER — Ambulatory Visit: Payer: BC Managed Care – PPO

## 2017-01-14 ENCOUNTER — Ambulatory Visit: Payer: BC Managed Care – PPO

## 2017-01-14 ENCOUNTER — Encounter: Payer: BC Managed Care – PPO | Admitting: Physician Assistant

## 2017-01-15 ENCOUNTER — Telehealth: Payer: Self-pay

## 2017-01-15 ENCOUNTER — Ambulatory Visit: Payer: BC Managed Care – PPO | Attending: Physician Assistant

## 2017-01-15 NOTE — Telephone Encounter (Signed)
No show. Called and left a message pertaining to today's appointment and a reminder for her next follow up session. Return phone call requested. Phone number [phone] provided.  

## 2017-01-17 ENCOUNTER — Ambulatory Visit: Payer: BC Managed Care – PPO

## 2017-01-29 ENCOUNTER — Telehealth: Payer: Self-pay

## 2017-01-29 ENCOUNTER — Ambulatory Visit: Payer: BC Managed Care – PPO

## 2017-01-29 NOTE — Telephone Encounter (Signed)
No show. Called patient and left a message pertaining to her appointment. Also informed pt of the cancellation and now show policy and since pt has not been able to come to her sessions, she might have to be removed from the schedule. Return phone call requested. Phone number [phone]) provided.

## 2017-01-31 ENCOUNTER — Ambulatory Visit: Payer: BC Managed Care – PPO

## 2017-02-03 IMAGING — CR DG CHEST 2V
1 series · 2 of 2 positions shown · non-contrast
Comparison: 06/25/2012

CLINICAL DATA: Preoperative evaluation for gastric sleeve surgery,
morbidly obese

EXAM:
CHEST  2 VIEW

[Series 1: w chest pa · 0.14mm/px · 2 of 2 slices shown]
[im 1/2]
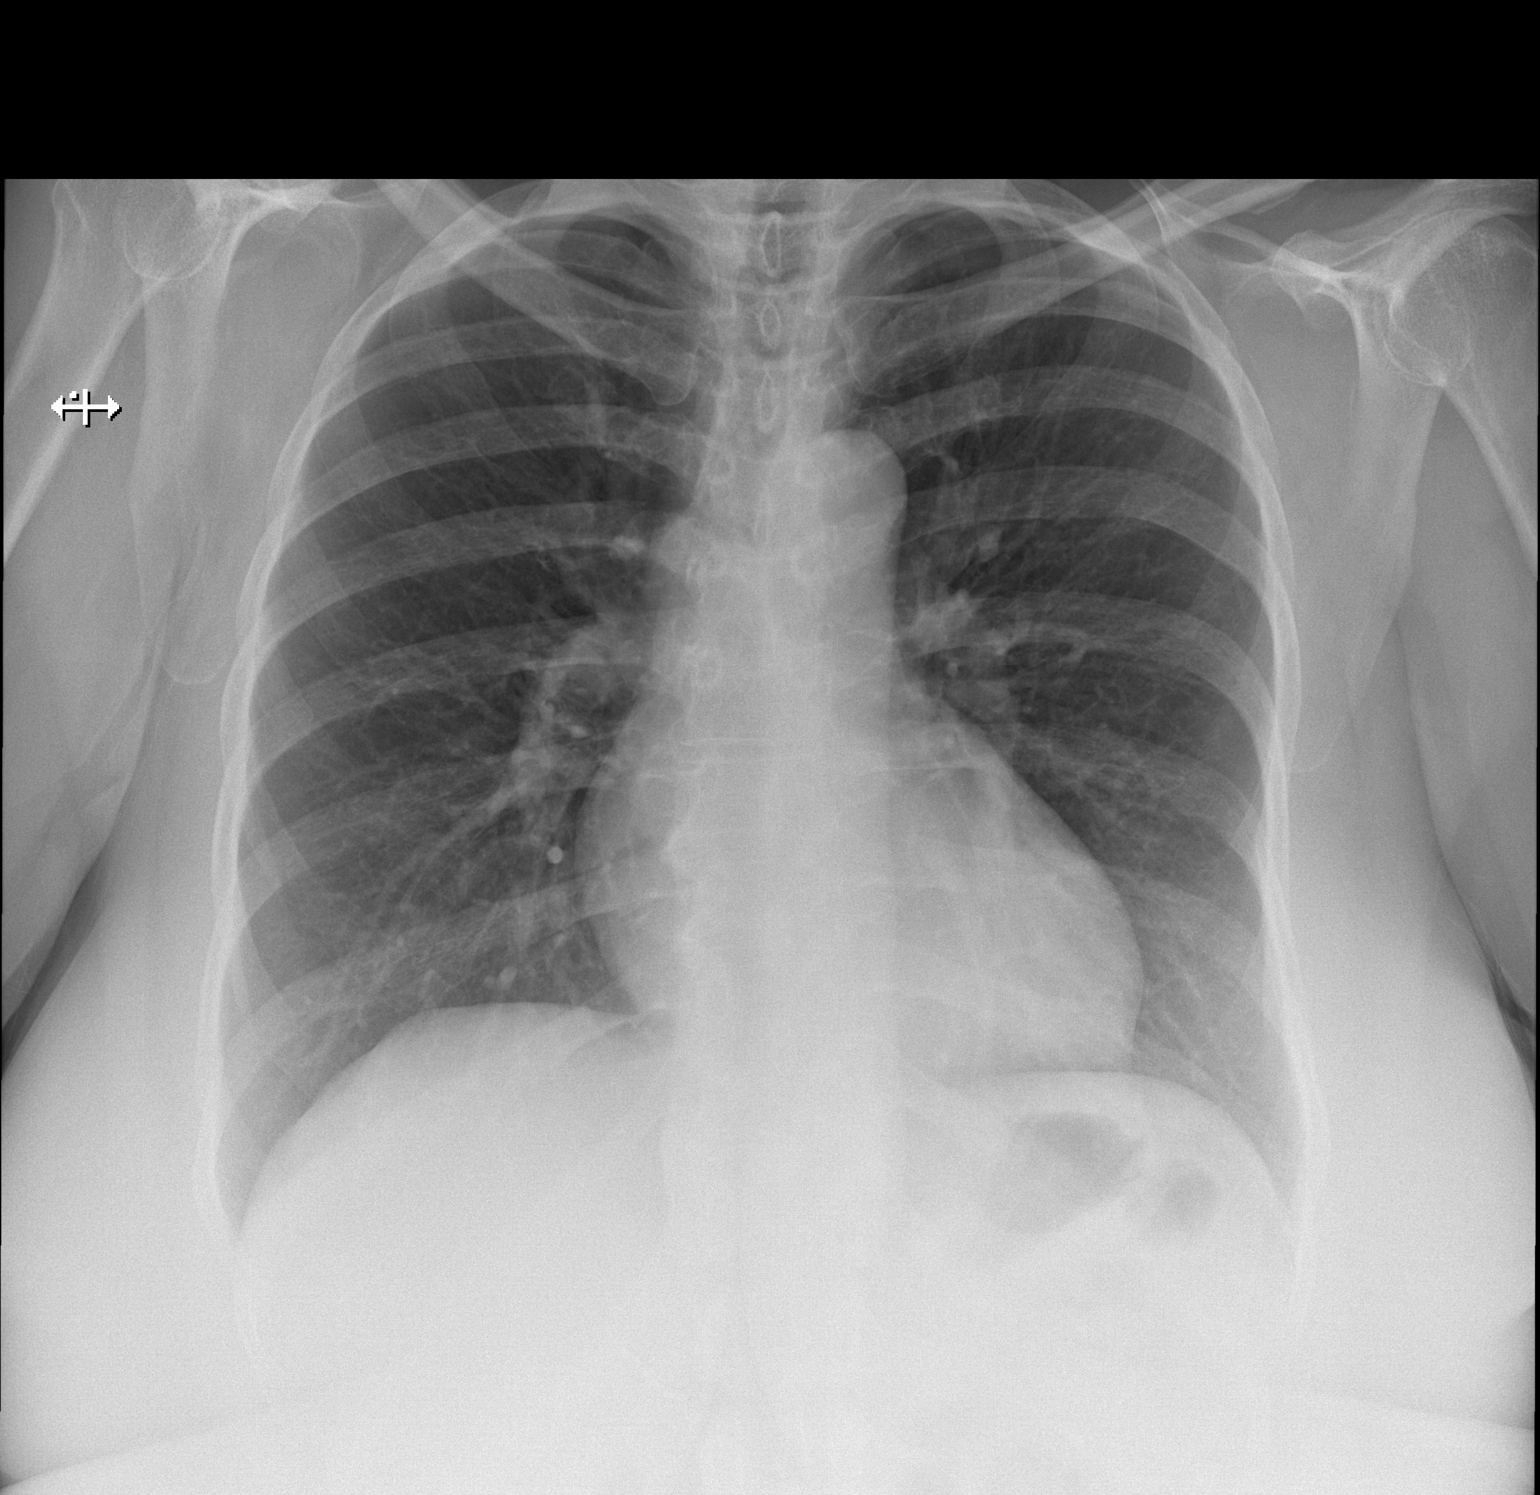
[im 2/2]
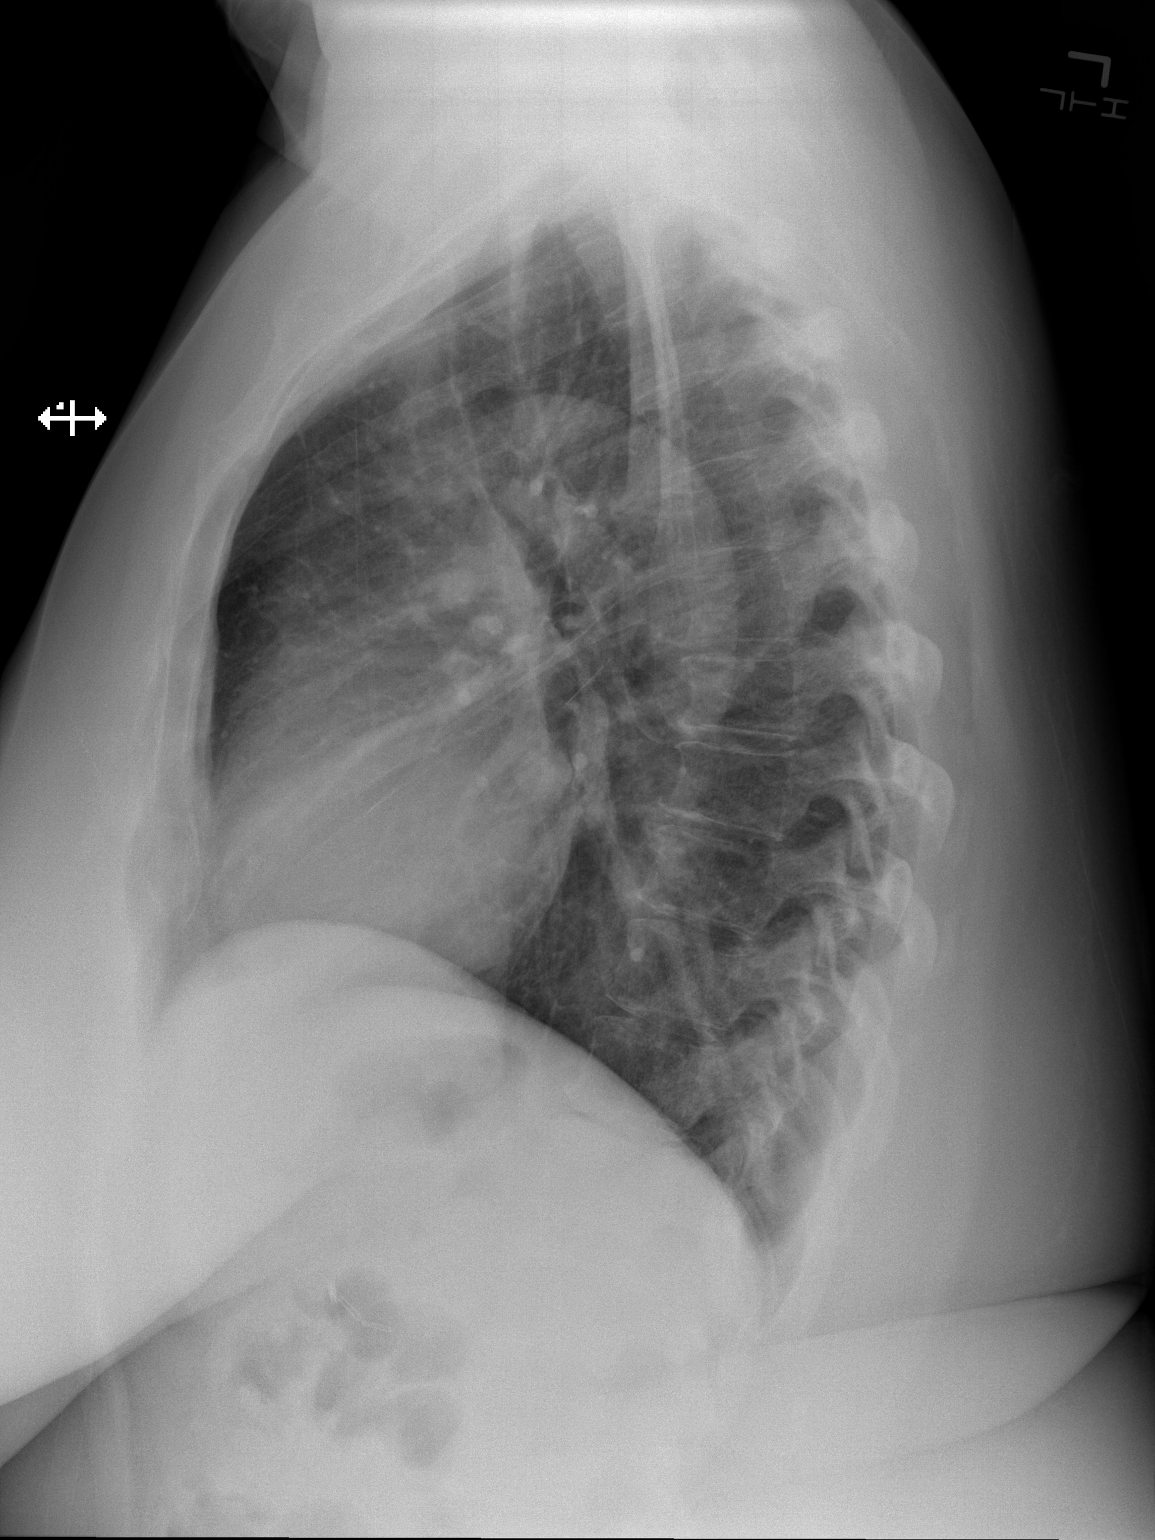

[2 of 2 positions shown; findings below may reference images not displayed]

FINDINGS: Cardiac shadow is stable. The lungs are clear bilaterally. No focal
infiltrate or sizable effusion is seen. No bony abnormality is
noted.
IMPRESSION: No active cardiopulmonary disease.

## 2017-02-04 ENCOUNTER — Encounter: Payer: Self-pay | Admitting: Physician Assistant

## 2017-02-04 ENCOUNTER — Ambulatory Visit (INDEPENDENT_AMBULATORY_CARE_PROVIDER_SITE_OTHER): Payer: BC Managed Care – PPO | Admitting: Physician Assistant

## 2017-02-04 VITALS — BP 138/64 | HR 62 | Resp 16 | Wt 248.0 lb

## 2017-02-04 DIAGNOSIS — R7303 Prediabetes: Secondary | ICD-10-CM

## 2017-02-04 DIAGNOSIS — D126 Benign neoplasm of colon, unspecified: Secondary | ICD-10-CM

## 2017-02-04 DIAGNOSIS — G8929 Other chronic pain: Secondary | ICD-10-CM

## 2017-02-04 DIAGNOSIS — Z8371 Family history of colonic polyps: Secondary | ICD-10-CM

## 2017-02-04 DIAGNOSIS — Z0000 Encounter for general adult medical examination without abnormal findings: Secondary | ICD-10-CM

## 2017-02-04 DIAGNOSIS — Z1239 Encounter for other screening for malignant neoplasm of breast: Secondary | ICD-10-CM

## 2017-02-04 DIAGNOSIS — M25512 Pain in left shoulder: Secondary | ICD-10-CM | POA: Diagnosis not present

## 2017-02-04 DIAGNOSIS — Z1231 Encounter for screening mammogram for malignant neoplasm of breast: Secondary | ICD-10-CM

## 2017-02-04 DIAGNOSIS — Z83719 Family history of colon polyps, unspecified: Secondary | ICD-10-CM

## 2017-02-04 NOTE — Progress Notes (Signed)
Patient: Nicole Jensen, Female    DOB: 02-Feb-1951, 66 y.o.   MRN: [id number] Visit Date: 02/04/2017  Today's Provider: Trinna Post, PA-C   Chief Complaint  Patient presents with  . Annual Exam   Subjective:    Annual physical exam Nicole Jensen is a 66 y.o. female who presents today for health maintenance and complete physical. She feels fairly well. She reports not exercising. She reports she is sleeping well.  Mammogram: 09/2015 normal PAP: hysterectomy DEXA: 2014, no osteoporosis Colonoscopy: 2017, tubular and serrated adenoma, repeat 3 years with Dr. Vira Agar  Still having left shoulder pain after completing PT. Never got XRAY. Her shoulder has improved some but not all the way. Would like to see ortho.   Did have another episode of light headedness in the store. Paramedics came: EKG, blood sugar, and BP normal.   Does not smoke, use alcohol or drugs. UTD on Tetanus. Declines breast exam today.  -----------------------------------------------------------------   Review of Systems  Constitutional: Negative.   HENT: Negative.   Eyes: Negative.   Respiratory: Negative.   Cardiovascular: Negative.   Gastrointestinal: Negative.   Endocrine: Negative.   Genitourinary: Negative.   Musculoskeletal: Positive for myalgias (Left arm/shoulder pain.).  Skin: Negative.   Allergic/Immunologic: Negative.   Neurological: Negative.   Hematological: Negative.   Psychiatric/Behavioral: Negative.     Social History      She  reports that she has never smoked. She has never used smokeless tobacco. She reports that she does not drink alcohol or use drugs.       Social History   Social History  . Marital status: Widowed    Spouse name: N/A  . Number of children: 1  . Years of education: College   Occupational History  . Full-Time Ab[employer]   Social History Main Topics  . Smoking status: Never Smoker  . Smokeless tobacco: Never Used  . Alcohol use No  . Drug use:  No  . Sexual activity: No   Other Topics Concern  . None   Social History Narrative  . None    Past Medical History:  Diagnosis Date  . Arthritis   . Leg cramps    right leg     Patient Active Problem List   Diagnosis Date Noted  . Prediabetes 11/02/2016  . Abnormal blood sugar 08/24/2015  . Allergic rhinitis 05/27/2015  . Leiomyoma of uterus 05/27/2015  . Hypercholesterolemia 05/27/2015  . Anterior optic neuritis 05/27/2015  . Avitaminosis D 05/27/2015    Past Surgical History:  Procedure Laterality Date  . ABDOMINAL HYSTERECTOMY     partial. pt reports she has cervix    Family History        Family Status  Relation Status  . Mother Deceased at age 49  . Father Deceased at age 38's  . Sister Alive  . Brother Deceased  . Brother Deceased  . Sister Alive  . Sister Alive  . Sister Alive  . Brother Alive  . Brother Alive        Her family history includes Colon cancer in her father; Hypertension in her mother, sister, and sister; Lung cancer in her mother.     No Known Allergies   Current Outpatient Prescriptions:  .  aspirin 81 MG tablet, Take 81 mg by mouth daily., Disp: , Rfl:  .  fluticasone (FLONASE) 50 MCG/ACT nasal spray, Place 2 sprays into both nostrils daily., Disp: 16 g, Rfl: 3 .  GARLIC PO, Take by mouth., Disp: , Rfl:  .  ibuprofen (ADVIL,MOTRIN) 200 MG tablet, Take 200 mg by mouth every 6 (six) hours as needed., Disp: , Rfl:  .  Cholecalciferol (VITAMIN D3) 5000 UNITS TABS, Take by mouth., Disp: , Rfl:    Patient Care Team: Paulene Floor as PCP - General (Physician Assistant)      Objective:   Vitals: BP 138/64 (BP Location: Left Arm, Patient Position: Sitting, Cuff Size: Large)   Pulse 62   Resp 16   Wt 248 lb (112.5 kg)   LMP  (LMP Unknown)   SpO2 97%   BMI 42.57 kg/m    Vitals:   02/04/17 1405  BP: 138/64  Pulse: 62  Resp: 16  SpO2: 97%  Weight: 248 lb (112.5 kg)     Physical Exam  Constitutional: She is  oriented to person, place, and time. She appears well-developed and well-nourished.  HENT:  Right Ear: Tympanic membrane and external ear normal.  Left Ear: Tympanic membrane and external ear normal.  Mouth/Throat: Oropharynx is clear and moist. No oropharyngeal exudate.  Eyes: Conjunctivae are normal.  Neck: Neck supple. No thyromegaly present.  Cardiovascular: Normal rate and regular rhythm.   Pulmonary/Chest: Effort normal and breath sounds normal.  Breast exam declined.  Abdominal: Soft. Bowel sounds are normal.  Lymphadenopathy:    She has no cervical adenopathy.  Neurological: She is alert and oriented to person, place, and time.  Skin: Skin is warm and dry.  Psychiatric: She has a normal mood and affect. Her behavior is normal.     Depression Screen PHQ 2/9 Scores 02/04/2017 08/24/2015  PHQ - 2 Score 0 0  PHQ- 9 Score 0 -      Assessment & Plan:     Routine Health Maintenance and Physical Exam  Exercise Activities and Dietary recommendations Goals    None       There is no immunization history on file for this patient.  Health Maintenance  Topic Date Due  . Hepatitis C Screening  11/27/1950  . HIV Screening  05/10/1967  . TETANUS/TDAP  05/10/1971  . PAP SMEAR  05/09/1973  . COLONOSCOPY  05/09/2002  . INFLUENZA VACCINE  02/13/2017  . MAMMOGRAM  09/15/2017     Discussed health benefits of physical activity, and encouraged her to engage in regular exercise appropriate for her age and condition.    1. Annual physical exam   2. Prediabetes  A1C today is 6.0, same as last time. Continue diet and exercise.  - POCT glycosylated hemoglobin (Hb A1C)  3. Chronic left shoulder pain  - Ambulatory referral to Orthopedics  4. Tubular adenoma of colon  Repeat colonoscopy 3 years in 2020.   5. Family history of colonic polyps   6. Breast cancer screening  Ordered breast exam today.  Return in about 1 year (around 02/04/2018).  The entirety of the  information documented in the History of Present Illness, Review of Systems and Physical Exam were personally obtained by me. Portions of this information were initially documented by Ashley Royalty, CMA and reviewed by me for thoroughness and accuracy.   --------------------------------------------------------------------    Trinna Post, PA-C  [facility]

## 2017-02-04 NOTE — Patient Instructions (Signed)

## 2017-02-05 LAB — POCT GLYCOSYLATED HEMOGLOBIN (HGB A1C): Hemoglobin A1C: 6

## 2017-02-26 ENCOUNTER — Ambulatory Visit
Admission: RE | Admit: 2017-02-26 | Discharge: 2017-02-26 | Disposition: A | Discharge status: Home / Self Care | Payer: BC Managed Care – PPO | Source: Ambulatory Visit | Attending: Physician Assistant | Admitting: Physician Assistant

## 2017-02-26 DIAGNOSIS — Z1231 Encounter for screening mammogram for malignant neoplasm of breast: Secondary | ICD-10-CM | POA: Insufficient documentation

## 2017-02-26 DIAGNOSIS — Z1239 Encounter for other screening for malignant neoplasm of breast: Secondary | ICD-10-CM

## 2017-07-16 HISTORY — PX: BARIATRIC SURGERY: SHX1103

## 2017-09-02 ENCOUNTER — Other Ambulatory Visit: Payer: Self-pay

## 2017-09-02 DIAGNOSIS — J309 Allergic rhinitis, unspecified: Secondary | ICD-10-CM

## 2017-09-03 MED ORDER — FLUTICASONE PROPIONATE 50 MCG/ACT NA SUSP: 2.0000 | Freq: Every day | NASAL | 3 refills | Status: DC

## 2019-02-23 ENCOUNTER — Other Ambulatory Visit: Payer: Self-pay

## 2019-02-23 ENCOUNTER — Ambulatory Visit (LOCAL_COMMUNITY_HEALTH_CENTER): Payer: Self-pay

## 2019-02-23 DIAGNOSIS — Z111 Encounter for screening for respiratory tuberculosis: Secondary | ICD-10-CM

## 2019-02-26 ENCOUNTER — Other Ambulatory Visit: Payer: Self-pay

## 2019-02-26 ENCOUNTER — Ambulatory Visit (LOCAL_COMMUNITY_HEALTH_CENTER): Payer: BC Managed Care – PPO

## 2019-02-26 DIAGNOSIS — Z111 Encounter for screening for respiratory tuberculosis: Secondary | ICD-10-CM

## 2019-02-26 LAB — TB SKIN TEST
Induration: 0 mm
TB Skin Test: NEGATIVE

## 2019-05-26 ENCOUNTER — Ambulatory Visit (INDEPENDENT_AMBULATORY_CARE_PROVIDER_SITE_OTHER): Payer: BC Managed Care – PPO | Admitting: Podiatry

## 2019-05-26 ENCOUNTER — Other Ambulatory Visit: Payer: Self-pay

## 2019-05-26 ENCOUNTER — Encounter: Payer: Self-pay | Admitting: Podiatry

## 2019-05-26 VITALS — BP 103/52 | HR 86

## 2019-05-26 DIAGNOSIS — Q828 Other specified congenital malformations of skin: Secondary | ICD-10-CM

## 2019-05-26 DIAGNOSIS — M79671 Pain in right foot: Secondary | ICD-10-CM

## 2019-05-26 DIAGNOSIS — R7309 Other abnormal glucose: Secondary | ICD-10-CM

## 2019-05-26 DIAGNOSIS — M79672 Pain in left foot: Secondary | ICD-10-CM | POA: Diagnosis not present

## 2019-05-26 NOTE — Progress Notes (Signed)
Subjective:  Patient ID: Nicole Jensen, female    DOB: 10/29/1950,  MRN: [id number]  Chief Complaint  Patient presents with  . Callouses    right foot (ball) is painful esp. w/ walking and pressure, started to hurt 2 weeks ago    68 y.o. female presents with the above complaint.  Patient states her hyperkeratotic lesion/calluses have started bothering her again after couple of years.  She states that she has been using metatarsal pads and they helped alleviate some of the pain.  This pain has been under control for last 3 years.  However it recently has getting worse none.  The pain is worse when she is ambulating especially right submet 4.  She also had custom-made orthotics made by Liliane Channel during her last visit 3 years ago however she has not worn them because it caused her pain.  I asked the patient if she brought the orthotics with her she denied.  She will see Liliane Channel to have them readjusted as needed.   Review of Systems: Negative except as noted in the HPI. Denies N/V/F/Ch.  Past Medical History:  Diagnosis Date  . Arthritis   . Leg cramps    right leg    Current Outpatient Medications:  .  aspirin 81 MG tablet, Take 81 mg by mouth daily., Disp: , Rfl:  .  calcium-vitamin D (OSCAL WITH D) 500-200 MG-UNIT tablet, Take by mouth., Disp: , Rfl:  .  Cholecalciferol (VITAMIN D3) 5000 UNITS TABS, Take by mouth., Disp: , Rfl:  .  fluticasone (FLONASE) 50 MCG/ACT nasal spray, Place 2 sprays into both nostrils daily., Disp: 16 g, Rfl: 3 .  ibuprofen (ADVIL,MOTRIN) 200 MG tablet, Take 200 mg by mouth every 6 (six) hours as needed., Disp: , Rfl:  .  Multiple Vitamin (MULTI-VITAMIN) tablet, Take by mouth., Disp: , Rfl:   Social History   Tobacco Use  Smoking Status Never Smoker  Smokeless Tobacco Never Used    No Known Allergies Objective:   Vitals:   05/26/19 0840  BP: (!) 103/52  Pulse: 86   There is no height or weight on file to calculate BMI. Constitutional Well developed.  Well nourished.  Vascular Dorsalis pedis pulses palpable bilaterally. Posterior tibial pulses palpable bilaterally. Capillary refill normal to all digits.  No cyanosis or clubbing noted. Pedal hair growth normal.  Neurologic Normal speech. Oriented to person, place, and time. Epicritic sensation to light touch grossly present bilaterally.  Dermatologic  hyperkeratotic lesion noted right submet 4 left submet 1 and submet 5.  Pain on palpation to all the lesions.  No pinpoint bleeding noted.  Plantar flexed metatarsals noted bilaterally.  Orthopedic: Normal joint ROM without pain or crepitus bilaterally. No visible deformities. No bony tenderness.   Radiographs: None Assessment:   1. Abnormal blood sugar   2. Foot pain, bilateral   3. Porokeratosis    Plan:  Patient was evaluated and treated and all questions answered.  Porokeratosis x3 -Using a chisel blade and a handle, the lesions were aggressively debrided down to healthy striated tissue.  No pinpoint bleeding noted. -I explained to the patient that she has increased pressure to the sites where the callus formations are occurring.  I recommended that she bring her custom-made orthotics and to have Rick adjust down and offload aggressively especially the right submet 4. -All treatment options discussed with the patient including all alternatives, risks, complications.  Return in about 2 weeks (around 06/09/2019), or if symptoms worsen or fail to improve,  for [employer] with [employer]iane [facility] for Orthotics adjustments.

## 2019-06-03 ENCOUNTER — Other Ambulatory Visit: Payer: Self-pay

## 2019-06-03 ENCOUNTER — Ambulatory Visit (INDEPENDENT_AMBULATORY_CARE_PROVIDER_SITE_OTHER): Payer: BC Managed Care – PPO | Admitting: Orthotics

## 2019-06-03 DIAGNOSIS — Q828 Other specified congenital malformations of skin: Secondary | ICD-10-CM | POA: Diagnosis not present

## 2019-06-03 DIAGNOSIS — M79671 Pain in right foot: Secondary | ICD-10-CM

## 2019-06-03 DIAGNOSIS — M79672 Pain in left foot: Secondary | ICD-10-CM

## 2019-06-03 NOTE — Progress Notes (Addendum)
Remake  F/o based upon 2017 scan:  Hug arch, dress, sulcus, p-cell, 4th met cut out left  [employer].

## 2019-06-03 NOTE — Addendum Note (Signed)
Addended by: Velora Heckler on: 06/03/2019 11:08 AM   Modules accepted: Level of Service

## 2019-06-23 ENCOUNTER — Encounter: Payer: BC Managed Care – PPO | Admitting: Physician Assistant

## 2019-07-01 ENCOUNTER — Ambulatory Visit: Payer: BC Managed Care – PPO | Admitting: Orthotics

## 2019-07-01 ENCOUNTER — Other Ambulatory Visit: Payer: Self-pay

## 2019-07-01 DIAGNOSIS — Q828 Other specified congenital malformations of skin: Secondary | ICD-10-CM

## 2019-07-01 NOTE — Progress Notes (Signed)
F/o not in...will call when they arrive.

## 2019-07-30 ENCOUNTER — Encounter: Payer: Self-pay | Admitting: Physician Assistant

## 2019-07-30 ENCOUNTER — Ambulatory Visit (INDEPENDENT_AMBULATORY_CARE_PROVIDER_SITE_OTHER): Payer: BC Managed Care – PPO | Admitting: Physician Assistant

## 2019-07-30 DIAGNOSIS — M25511 Pain in right shoulder: Secondary | ICD-10-CM

## 2019-07-30 DIAGNOSIS — R0981 Nasal congestion: Secondary | ICD-10-CM

## 2019-07-30 DIAGNOSIS — J309 Allergic rhinitis, unspecified: Secondary | ICD-10-CM | POA: Diagnosis not present

## 2019-07-30 MED ORDER — DM-GUAIFENESIN ER 30-600 MG PO TB12: 1.0000 | ORAL_TABLET | Freq: Two times a day (BID) | ORAL | 1 refills | Status: DC | PRN

## 2019-07-30 MED ORDER — MELOXICAM 7.5 MG PO TABS
7.5000 mg | ORAL_TABLET | Freq: Every day | ORAL | 0 refills | Status: DC
Start: 2019-07-30 — End: 2019-11-02

## 2019-07-30 MED ORDER — FLUTICASONE PROPIONATE 50 MCG/ACT NA SUSP: 2.0000 | Freq: Every day | NASAL | 3 refills | Status: DC

## 2019-07-30 NOTE — Progress Notes (Signed)
Patient: Nicole Jensen Female    DOB: 1951/05/16   69 y.o.   MRN: [id number] Visit Date: 07/30/2019  Today's Provider: Mar Daring, PA-C   No chief complaint on file.  Subjective:    Virtual Visit via Telephone Note  I connected with NIHAL GALLOZA on 07/30/19 at  6:20 PM EST by telephone and verified that I am speaking with the correct person using two identifiers.  Location: Patient: Home Provider: [facility]   I discussed the limitations, risks, security and privacy concerns of performing an evaluation and management service by telephone and the availability of in person appointments. I also discussed with the patient that there may be a patient responsible charge related to this service. The patient expressed understanding and agreed to proceed.  URI  This is a new problem. The current episode started in the past 7 days (started Thursday, 07/23/19). The maximum temperature recorded prior to her arrival was 103 - 104 F (Pt reports a temp once at 103.4 on Sunday). Associated symptoms include congestion, coughing and rhinorrhea. Pertinent negatives include no abdominal pain, ear pain, nausea, sinus pain, sneezing, sore throat or wheezing. She has tried acetaminophen for the symptoms. The treatment provided mild relief.  Had covid testing on Monday and was negative. Her daughter, son-in-law, and 68 yr old grandson are positive for covid. Her and her 34 yr grandson have been negative.   She also complains of right shoulder pain. Reports it has been present for months. Reports she initially injured it with laundry. Her laundry had been out so she was carrying loads of laundry from her house to the laundry mat and back. She also reports picking up big things of water aggravated it as well.   No Known Allergies   Current Outpatient Medications:  .  aspirin 81 MG tablet, Take 81 mg by mouth daily., Disp: , Rfl:  .  calcium-vitamin D (OSCAL WITH D) 500-200 MG-UNIT tablet, Take by  mouth., Disp: , Rfl:  .  Cholecalciferol (VITAMIN D3) 5000 UNITS TABS, Take by mouth., Disp: , Rfl:  .  fluticasone (FLONASE) 50 MCG/ACT nasal spray, Place 2 sprays into both nostrils daily., Disp: 16 g, Rfl: 3 .  ibuprofen (ADVIL,MOTRIN) 200 MG tablet, Take 200 mg by mouth every 6 (six) hours as needed., Disp: , Rfl:  .  Multiple Vitamin (MULTI-VITAMIN) tablet, Take by mouth., Disp: , Rfl:   Review of Systems  Constitutional: Positive for chills, diaphoresis and fatigue. Negative for activity change, appetite change, fever and unexpected weight change.  HENT: Positive for congestion and rhinorrhea. Negative for ear discharge, ear pain, postnasal drip, sinus pressure, sinus pain, sneezing and sore throat.   Respiratory: Positive for cough and shortness of breath. Negative for apnea, choking, chest tightness, wheezing and stridor.   Gastrointestinal: Negative.  Negative for abdominal pain and nausea.  Musculoskeletal: Positive for arthralgias.    Social History   Tobacco Use  . Smoking status: Never Smoker  . Smokeless tobacco: Never Used  Substance Use Topics  . Alcohol use: No    Alcohol/week: 0.0 standard drinks      Objective:   LMP  (LMP Unknown) Comment: partial hysterectomy There were no vitals filed for this visit.There is no height or weight on file to calculate BMI.   Physical Exam Vitals reviewed.  Constitutional:      General: She is not in acute distress. Pulmonary:     Effort: No respiratory distress.  Neurological:  Mental Status: She is alert.      No results found for any visits on 07/30/19.     Assessment & Plan    1. Allergic rhinitis, unspecified seasonality, unspecified trigger Stable. Diagnosis pulled for medication refill. Continue current medical treatment plan. - fluticasone (FLONASE) 50 MCG/ACT nasal spray; Place 2 sprays into both nostrils daily.  Dispense: 16 g; Refill: 3  2. Complaint of nasal congestion Will send Mucinex as below for  cough and congestion. Patient was covid tested on Monday and was negative but does live in a houselhold with positive. She was advised to continue to quarantine the best she can. Call if symptoms worsen or change. - dextromethorphan-guaiFENesin (MUCINEX DM) 30-600 MG 12hr tablet; Take 1 tablet by mouth 2 (two) times daily as needed for cough.  Dispense: 30 tablet; Refill: 1  3. Acute pain of right shoulder Worsening over the last couple months. Will try meloxicam as below. Advised if not improving she needs to come in for in office evaluation once she is feeling better from the URI symptoms. She agrees.  - meloxicam (MOBIC) 7.5 MG tablet; Take 1 tablet (7.5 mg total) by mouth daily.  Dispense: 30 tablet; Refill: 0    I discussed the assessment and treatment plan with the patient. The patient was provided an opportunity to ask questions and all were answered. The patient agreed with the plan and demonstrated an understanding of the instructions.   The patient was advised to call back or seek an in-person evaluation if the symptoms worsen or if the condition fails to improve as anticipated.  I provided 15 minutes of non-face-to-face time during this encounter.     Mar Daring, PA-C  [facility]

## 2019-08-05 DIAGNOSIS — Z9884 Bariatric surgery status: Secondary | ICD-10-CM | POA: Diagnosis not present

## 2019-08-07 ENCOUNTER — Telehealth: Payer: Self-pay | Admitting: Physician Assistant

## 2019-08-07 DIAGNOSIS — R7303 Prediabetes: Secondary | ICD-10-CM

## 2019-08-07 DIAGNOSIS — Z9884 Bariatric surgery status: Secondary | ICD-10-CM

## 2019-08-07 DIAGNOSIS — E559 Vitamin D deficiency, unspecified: Secondary | ICD-10-CM

## 2019-08-07 DIAGNOSIS — E7800 Pure hypercholesterolemia, unspecified: Secondary | ICD-10-CM

## 2019-08-07 NOTE — Telephone Encounter (Signed)
Received lab slips from [facility] for patient to have labs checked locally. Can we see when she is planning to come for these as I will have to transition them to our orders and want to make sure to have done before she needs them?  Thanks. JB

## 2019-08-07 NOTE — Telephone Encounter (Signed)
Tried calling; pt's voicemail is not set up.  PEC  When pt calls back please ask her about below.   Thanks,   -Mickel Baas

## 2019-08-10 NOTE — Telephone Encounter (Signed)
Patient advised as below.  

## 2019-08-10 NOTE — Telephone Encounter (Signed)
Patient called in stating she had paperwork sent over to office and would like to know when completed so she can come in office to have lab work. Please advise and call back.

## 2019-08-10 NOTE — Telephone Encounter (Signed)
Labs ordered. Need to be done fasting.

## 2019-08-18 NOTE — Telephone Encounter (Signed)
Faxed lab results to [facility] [phone].

## 2019-08-18 NOTE — Telephone Encounter (Signed)
Please advise 

## 2019-08-18 NOTE — Telephone Encounter (Signed)
Patient requesting results be faxed back to [facility]. Patient does not have fax number.

## 2019-08-23 ENCOUNTER — Other Ambulatory Visit: Payer: Self-pay | Admitting: Physician Assistant

## 2019-08-23 DIAGNOSIS — M25511 Pain in right shoulder: Secondary | ICD-10-CM

## 2019-08-24 ENCOUNTER — Telehealth: Payer: Self-pay

## 2019-08-24 LAB — CBC WITH DIFFERENTIAL/PLATELET
Basophils Absolute: 0 10*3/uL (ref 0.0–0.2)
Basos: 0 %
EOS (ABSOLUTE): 0.1 10*3/uL (ref 0.0–0.4)
Eos: 2 %
Hematocrit: 32.4 % — ABNORMAL LOW (ref 34.0–46.6)
Hemoglobin: 10.3 g/dL — ABNORMAL LOW (ref 11.1–15.9)
Immature Grans (Abs): 0 10*3/uL (ref 0.0–0.1)
Immature Granulocytes: 0 %
Lymphocytes Absolute: 1.4 10*3/uL (ref 0.7–3.1)
Lymphs: 40 %
MCH: 27 pg (ref 26.6–33.0)
MCHC: 31.8 g/dL (ref 31.5–35.7)
MCV: 85 fL (ref 79–97)
Monocytes Absolute: 0.4 10*3/uL (ref 0.1–0.9)
Monocytes: 12 %
Neutrophils Absolute: 1.6 10*3/uL (ref 1.4–7.0)
Neutrophils: 46 %
Platelets: 289 10*3/uL (ref 150–450)
RBC: 3.81 x10E6/uL (ref 3.77–5.28)
RDW: 14.6 % (ref 11.7–15.4)
WBC: 3.5 10*3/uL (ref 3.4–10.8)

## 2019-08-24 LAB — COMPREHENSIVE METABOLIC PANEL
ALT: 19 IU/L (ref 0–32)
AST: 24 IU/L (ref 0–40)
Albumin/Globulin Ratio: 1.3 (ref 1.2–2.2)
Albumin: 3.8 g/dL (ref 3.8–4.8)
Alkaline Phosphatase: 75 IU/L (ref 39–117)
BUN/Creatinine Ratio: 19 (ref 12–28)
BUN: 15 mg/dL (ref 8–27)
Bilirubin Total: 0.3 mg/dL (ref 0.0–1.2)
CO2: 23 mmol/L (ref 20–29)
Calcium: 9.3 mg/dL (ref 8.7–10.3)
Chloride: 108 mmol/L — ABNORMAL HIGH (ref 96–106)
Creatinine, Ser: 0.77 mg/dL (ref 0.57–1.00)
GFR calc Af Amer: 92 mL/min/{1.73_m2} (ref >59)
GFR calc non Af Amer: 80 mL/min/{1.73_m2} (ref >59)
Globulin, Total: 3 g/dL (ref 1.5–4.5)
Glucose: 83 mg/dL (ref 65–99)
Potassium: 4.2 mmol/L (ref 3.5–5.2)
Sodium: 143 mmol/L (ref 134–144)
Total Protein: 6.8 g/dL (ref 6.0–8.5)

## 2019-08-24 LAB — VITAMIN E
Vitamin E (Alpha Tocopherol): 17.7 mg/L (ref 9.0–29.0)
Vitamin E(Gamma Tocopherol): 1.1 mg/L (ref 0.5–4.9)

## 2019-08-24 LAB — LIPID PANEL WITH LDL/HDL RATIO
Cholesterol, Total: 198 mg/dL (ref 100–199)
HDL: 72 mg/dL (ref >39)
LDL Chol Calc (NIH): 116 mg/dL — ABNORMAL HIGH (ref 0–99)
LDL/HDL Ratio: 1.6 ratio (ref 0.0–3.2)
Triglycerides: 52 mg/dL (ref 0–149)
VLDL Cholesterol Cal: 10 mg/dL (ref 5–40)

## 2019-08-24 LAB — PTH, INTACT AND CALCIUM: PTH: 22 pg/mL (ref 15–65)

## 2019-08-24 LAB — MAGNESIUM: Magnesium: 1.9 mg/dL (ref 1.6–2.3)

## 2019-08-24 LAB — TSH: TSH: 1.76 u[IU]/mL (ref 0.450–4.500)

## 2019-08-24 LAB — VITAMIN A: Vitamin A: 45.5 ug/dL (ref 22.0–69.5)

## 2019-08-24 LAB — VITAMIN D 1,25 DIHYDROXY
Vitamin D 1, 25 (OH)2 Total: 51 pg/mL
Vitamin D2 1, 25 (OH)2: <10 pg/mL
Vitamin D3 1, 25 (OH)2: 51 pg/mL

## 2019-08-24 LAB — VITAMIN B1: Thiamine: 138.3 nmol/L (ref 66.5–200.0)

## 2019-08-24 LAB — ZINC: Zinc: 80 ug/dL (ref 44–115)

## 2019-08-24 LAB — VITAMIN B12: Vitamin B-12: 940 pg/mL (ref 232–1245)

## 2019-08-24 LAB — HEMOGLOBIN A1C
Est. average glucose Bld gHb Est-mCnc: 120 mg/dL
Hgb A1c MFr Bld: 5.8 % — ABNORMAL HIGH (ref 4.8–5.6)

## 2019-08-24 LAB — IRON: Iron: 48 ug/dL (ref 27–139)

## 2019-08-24 LAB — VITAMIN K1, SERUM: VITAMIN K1: 0.17 ng/mL (ref 0.13–1.88)

## 2019-08-24 LAB — FERRITIN: Ferritin: 489 ng/mL — ABNORMAL HIGH (ref 15–150)

## 2019-08-24 LAB — COPPER, SERUM: Copper: 123 ug/dL (ref 80–158)

## 2019-08-24 LAB — FOLATE: Folate: >20 ng/mL (ref >3.0)

## 2019-08-24 NOTE — Telephone Encounter (Signed)
-----   Message from Mar Daring, [place] sent at 08/24/2019  5:04 PM EST ----- All labs have finally resulted and look stable or improved to labs we had on file from 2 years ago. I have printed and we will fax to [facility] for you.

## 2019-08-24 NOTE — Telephone Encounter (Signed)
Patient advised as directed below. 

## 2019-10-06 ENCOUNTER — Other Ambulatory Visit: Payer: Self-pay | Admitting: Physician Assistant

## 2019-10-06 DIAGNOSIS — J309 Allergic rhinitis, unspecified: Secondary | ICD-10-CM

## 2019-10-06 NOTE — Telephone Encounter (Signed)
Requested medication (s) are due for refill today:   Yes  Requested medication (s) are on the active medication list:   Yes  Future visit scheduled:   No   Last ordered: 07/30/2019  16 g with 3 refills.  Clinic note:   Protocol is she has not been seen within the last 12 months however she was just seen on 07/30/2019 and prescribed the Flonase so returning for provider to review for refill.   Requested Prescriptions  Pending Prescriptions Disp Refills   fluticasone (FLONASE) 50 MCG/ACT nasal spray [Pharmacy Med Name: FLUTICASONE PROP 50 MCG SPRAY] 48 mL 1    Sig: SPRAY 2 SPRAYS INTO EACH NOSTRIL EVERY DAY      Ear, Nose, and Throat: Nasal Preparations - Corticosteroids Failed - 10/06/2019  9:03 AM      Failed - Valid encounter within last 12 months    Recent Outpatient Visits           2 months ago Complaint of nasal congestion   [facility], Clearnce Sorrel, [place]   2 years ago Annual physical exam   [facility] Carles Collet M, [place]   2 years ago Abnormal blood sugar   [facility] Carles Collet M, [place]   2 years ago Adhesive capsulitis of left shoulder   [facility] Carles Collet M, [place]   4 years ago Annual physical exam   [facility] Margarita Rana, MD

## 2019-10-09 NOTE — Telephone Encounter (Signed)
Please cancel refill of this prescription. I have not seen her in three years and she has not been seen in > 1 year. She is due for an annual exam and she will need to come into the office for future refills.

## 2019-10-09 NOTE — Telephone Encounter (Signed)
Refills have been cancelled.

## 2019-11-02 ENCOUNTER — Other Ambulatory Visit: Payer: Self-pay | Admitting: Physician Assistant

## 2019-11-02 DIAGNOSIS — M25511 Pain in right shoulder: Secondary | ICD-10-CM

## 2019-11-09 ENCOUNTER — Telehealth: Payer: Self-pay | Admitting: Physician Assistant

## 2019-11-09 ENCOUNTER — Telehealth: Payer: Self-pay

## 2019-11-09 NOTE — Telephone Encounter (Signed)
That is fine.  Please set expectations - I will recommend that she is seen regularly for CPE and chronic medical conditions.

## 2019-11-09 NOTE — Telephone Encounter (Signed)
Medication Refill - Medication: fluticasone (FLONASE) 50 MCG/ACT nasal spray   Has the patient contacted their pharmacy? Yes.   (Agent: If no, request that the patient contact the pharmacy for the refill.) (Agent: If yes, when and what did the pharmacy advise?)  Preferred Pharmacy (with phone number or street name):  [id number] - [place], [place] [address] Phone:  [phone]  Fax:  [phone]       Agent: Please be advised that RX refills may take up to 3 business days. We ask that you follow-up with your pharmacy.

## 2019-11-09 NOTE — Telephone Encounter (Signed)
Copied from [place] [phone]. Topic: Appointment Scheduling - Transfer of Care >> Nov 09, 2019 11:30 AM Alanda Slim E wrote: Pt is requesting to transfer FROM: Nicole Jensen Pt is requesting to transfer TO: Dr. Brita Romp Reason for requested transfer: Pt stated that she wanted to change Dr  Lovena Neighbours CRM to patient's current PCP (transferring FROM).

## 2019-11-10 ENCOUNTER — Telehealth: Payer: Self-pay

## 2019-11-10 ENCOUNTER — Other Ambulatory Visit: Payer: Self-pay | Admitting: Physician Assistant

## 2019-11-10 ENCOUNTER — Other Ambulatory Visit: Payer: Self-pay | Admitting: *Deleted

## 2019-11-10 DIAGNOSIS — M25511 Pain in right shoulder: Secondary | ICD-10-CM

## 2019-11-10 DIAGNOSIS — J309 Allergic rhinitis, unspecified: Secondary | ICD-10-CM

## 2019-11-10 MED ORDER — FLUTICASONE PROPIONATE 50 MCG/ACT NA SUSP: NASAL | 1 refills | Status: DC

## 2019-11-10 NOTE — Telephone Encounter (Signed)
LMTCB 11/10/2019.  PEC Please advise pt as below.   Thanks,   -Mickel Baas

## 2019-11-10 NOTE — Telephone Encounter (Signed)
Patient called requesting this refill. Please advise.

## 2019-11-10 NOTE — Telephone Encounter (Signed)
Copied from [place] [id number]. Topic: General - Inquiry >> Nov 10, 2019 10:30 AM Nicole Jensen, NT wrote: Reason for CRM: Patient called in asking what would be a good OTC nose spray while she waits for her flonase refill. Please advise.

## 2019-11-10 NOTE — Telephone Encounter (Signed)
LMTCB 11/10/2019.  PEC Please advise pt below.     Thanks,   -Mickel Baas

## 2019-11-10 NOTE — Telephone Encounter (Signed)
Requested Prescriptions  Pending Prescriptions Disp Refills  . fluticasone (FLONASE) 50 MCG/ACT nasal spray 48 mL 1    Sig: SPRAY 2 SPRAYS INTO EACH NOSTRIL EVERY DAY     Ear, Nose, and Throat: Nasal Preparations - Corticosteroids Failed - 11/10/2019 10:34 AM      Failed - Valid encounter within last 12 months    Recent Outpatient Visits          3 months ago Complaint of nasal congestion   [facility], Clearnce Sorrel, [place]   2 years ago Annual physical exam   [facility] Carles Collet M, [place]   3 years ago Abnormal blood sugar   [facility] Carles Collet M, [place]   3 years ago Adhesive capsulitis of left shoulder   [facility] Carles Collet M, [place]   4 years ago Annual physical exam   [facility] Margarita Rana, MD

## 2019-11-10 NOTE — Telephone Encounter (Signed)
Flonase is available OTC. She is also overdue for a physical and should schedule.

## 2019-11-11 NOTE — Telephone Encounter (Signed)
Left message to call back  

## 2019-11-16 NOTE — Telephone Encounter (Signed)
Spoke with patient and she stated that she was able to get the Flonase. Patient also scheduled appointment for 11/27/2019.

## 2019-11-27 ENCOUNTER — Encounter: Payer: Self-pay | Admitting: Physician Assistant

## 2019-12-18 ENCOUNTER — Encounter: Payer: Self-pay | Admitting: Physician Assistant

## 2019-12-18 ENCOUNTER — Ambulatory Visit (INDEPENDENT_AMBULATORY_CARE_PROVIDER_SITE_OTHER): Payer: BC Managed Care – PPO | Admitting: Physician Assistant

## 2019-12-18 ENCOUNTER — Other Ambulatory Visit: Payer: Self-pay

## 2019-12-18 VITALS — BP 122/74 | HR 67 | Temp 96.8°F | Ht 64.0 in | Wt 183.6 lb

## 2019-12-18 DIAGNOSIS — Z0000 Encounter for general adult medical examination without abnormal findings: Secondary | ICD-10-CM

## 2019-12-18 DIAGNOSIS — Z23 Encounter for immunization: Secondary | ICD-10-CM | POA: Diagnosis not present

## 2019-12-18 DIAGNOSIS — Z1231 Encounter for screening mammogram for malignant neoplasm of breast: Secondary | ICD-10-CM

## 2019-12-18 NOTE — Progress Notes (Signed)
Annual Wellness Visit     Patient: Nicole Jensen, Female    DOB: Jan 16, 1951, 69 y.o.   MRN: [id number] Visit Date: 12/18/2019  Today's Provider: Trinna Post, PA-C   Chief Complaint  Patient presents with  . Medicare Wellness  I,Brexton Sofia M Manoj Enriquez,acting as a scribe for Trinna Post, PA-C.,have documented all relevant documentation on the behalf of Trinna Post, PA-C,as directed by  Trinna Post, PA-C while in the presence of Trinna Post, PA-C.  Subjective    Nicole Jensen is a 69 y.o. female who presents today for her Annual Wellness Visit. She reports consuming a general diet. The patient does not participate in regular exercise at present. She generally feels well. She reports sleeping well. She does not have additional problems to discuss today.   HPI  History gastric sleeve 2019. Starting weight 248 lbs.  Mammogram due, last normal 2018.  Colonoscopy 2017 with multiple tubular adenomas. No specific follow up identifiable in charts. Recommend 3-5 years and that she schedule with Jef[facility] Bryant for colonoscopy.     Medications: Outpatient Medications Prior to Visit  Medication Sig  . aspirin 81 MG tablet Take 81 mg by mouth daily.  . calcium-vitamin D (OSCAL WITH D) 500-200 MG-UNIT tablet Take by mouth.  . Cholecalciferol (VITAMIN D3) 5000 UNITS TABS Take by mouth.  . dextromethorphan-guaiFENesin (MUCINEX DM) 30-600 MG 12hr tablet Take 1 tablet by mouth 2 (two) times daily as needed for cough.  . fluticasone (FLONASE) 50 MCG/ACT nasal spray SPRAY 2 SPRAYS INTO EACH NOSTRIL EVERY DAY  . ibuprofen (ADVIL,MOTRIN) 200 MG tablet Take 200 mg by mouth every 6 (six) hours as needed.  . Multiple Vitamin (MULTI-VITAMIN) tablet Take by mouth.  . meloxicam (MOBIC) 7.5 MG tablet TAKE 1 TABLET BY MOUTH EVERY DAY (Patient not taking: Reported on 12/18/2019)   No facility-administered medications prior to visit.    No Known Allergies  Patient Care Team: Paulene Floor as PCP - General (Physician Assistant)  Review of Systems  Constitutional: Negative.   HENT: Negative.   Eyes: Negative.   Respiratory: Negative.   Cardiovascular: Negative.   Gastrointestinal: Negative.   Endocrine: Negative.   Genitourinary: Negative.   Musculoskeletal: Positive for arthralgias.  Skin: Negative.   Allergic/Immunologic: Negative.   Neurological: Negative.   Hematological: Negative.   Psychiatric/Behavioral: Negative.       Objective    Vitals: BP 122/74 (BP Location: Left Arm, Patient Position: Sitting, Cuff Size: Normal)   Pulse 67   Temp (!) 96.8 F (36 C) (Temporal)   Ht 5\' 4"  (1.626 m)   Wt 183 lb 9.6 oz (83.3 kg)   LMP  (LMP Unknown) Comment: partial hysterectomy  SpO2 97%   BMI 31.51 kg/m    Physical Exam Constitutional:      Appearance: Normal appearance.  HENT:     Right Ear: Tympanic membrane, ear canal and external ear normal.     Left Ear: Tympanic membrane, ear canal and external ear normal.  Cardiovascular:     Rate and Rhythm: Normal rate and regular rhythm.     Pulses: Normal pulses.     Heart sounds: Normal heart sounds.  Pulmonary:     Effort: Pulmonary effort is normal.     Breath sounds: Normal breath sounds.  Abdominal:     General: Abdomen is flat. Bowel sounds are normal.     Palpations: Abdomen is soft.  Skin:    General: Skin is  warm and dry.  Neurological:     General: No focal deficit present.     Mental Status: She is alert and oriented to person, place, and time.  Psychiatric:        Mood and Affect: Mood normal.        Behavior: Behavior normal.      Most recent functional status assessment: In your present state of health, do you have any difficulty performing the following activities: 12/18/2019  Hearing? N  Vision? N  Difficulty concentrating or making decisions? N  Walking or climbing stairs? N  Dressing or bathing? N  Doing errands, shopping? N  Some recent data might be hidden    Most  recent fall risk assessment: Fall Risk  12/18/2019  Falls in the past year? 0  Number falls in past yr: 0  Injury with Fall? 0     Most recent depression screenings: PHQ 2/9 Scores 12/18/2019 02/04/2017  PHQ - 2 Score 0 0  PHQ- 9 Score 0 0    Most recent cognitive screening: No flowsheet data found.  Results for orders placed or performed in visit on 12/18/19  HM COLONOSCOPY  Result Value Ref Range   HM Colonoscopy See Report (in chart) See Report (in chart), Patient Reported  HM COLONOSCOPY  Result Value Ref Range   HM Colonoscopy See Report (in chart) See Report (in chart), Patient Reported  HM COLONOSCOPY  Result Value Ref Range   HM Colonoscopy See Report (in chart) See Report (in chart), Patient Reported    Assessment & Plan    1. Annual Physical Exam  Patient had colonoscopy done at [facility] in [facility]. Patient was advised to have a pneumonia vaccine and patient declined vaccine.  2. Encounter for screening mammogram for malignant neoplasm of breast  - MM 3D SCREEN BREAST BILATERAL  3. Need for Tdap vaccination  - Tdap vaccine greater than or equal to 7yo IM  Annual wellness visit done today including the all of the following: Reviewed patient's Family Medical History Reviewed and updated list of patient's medical providers Assessment of cognitive impairment was done Assessed patient's functional ability Established a written schedule for health screening [place] Completed and Reviewed  Exercise Activities and Dietary recommendations Goals   None     Immunization History  Administered Date(s) Administered  . Influenza Split 09/01/2010  . PPD Test 02/23/2019  . Tdap 12/02/2008, 12/18/2019    Health Maintenance  Topic Date Due  . Hepatitis C Screening  Never done  . COVID-19 Vaccine (1) Never done  . MAMMOGRAM  02/27/2019  . PNA vac Low Risk Adult (1 of 2 - PCV13) 12/17/2020 (Originally 05/09/2017)  . INFLUENZA VACCINE  02/14/2020  .  COLONOSCOPY  07/12/2026  . TETANUS/TDAP  12/17/2029  . DEXA SCAN  Completed     Discussed health benefits of physical activity, and encouraged her to engage in regular exercise appropriate for her age and condition.      Return in about 1 year (around 12/17/2020) for CPE.     ITrinna Post, PA-C, have reviewed all documentation for this visit. The documentation on 12/18/19 for the exam, diagnosis, procedures, and orders are all accurate and complete.    Paulene Floor  [facility] [phone] (phone) [phone] (fax)  [place]

## 2020-01-13 NOTE — Progress Notes (Signed)
N/S apt.  This encounter was created in error - please disregard. 

## 2020-01-14 ENCOUNTER — Other Ambulatory Visit: Payer: Self-pay

## 2020-01-21 NOTE — Progress Notes (Signed)
N/S apt.  This encounter was created in error - please disregard. 

## 2020-02-01 ENCOUNTER — Other Ambulatory Visit: Payer: Self-pay

## 2020-03-08 DIAGNOSIS — R252 Cramp and spasm: Secondary | ICD-10-CM | POA: Diagnosis not present

## 2020-03-08 DIAGNOSIS — Z9884 Bariatric surgery status: Secondary | ICD-10-CM | POA: Diagnosis not present

## 2020-04-13 NOTE — Progress Notes (Signed)
Subjective:   Nicole Jensen is a 69 y.o. female who presents for an Initial Medicare Annual Wellness Visit.  I connected with Nicole Jensen today by telephone and verified that I am speaking with the correct person using two identifiers. Location patient: home Location provider: work Persons participating in the virtual visit: patient, provider.   I discussed the limitations, risks, security and privacy concerns of performing an evaluation and management service by telephone and the availability of in person appointments. I also discussed with the patient that there may be a patient responsible charge related to this service. The patient expressed understanding and verbally consented to this telephonic visit.    Interactive audio and video telecommunications were attempted between this provider and patient, however failed, due to patient having technical difficulties OR patient did not have access to video capability.  We continued and completed visit with audio only.  Review of Systems    N/A  Cardiac Risk Factors include: advanced age (>77men, >67 women);obesity (BMI >30kg/m2)     Objective:    There were no vitals filed for this visit. There is no height or weight on file to calculate BMI.  Advanced Directives 04/14/2020 11/13/2016 06/24/2015 05/27/2015 05/27/2015  Does Patient Have a Medical Advance Directive? Yes No No No No  Type of Paramedic of [place];Living will - - - -  Copy of [place] in Chart? No - copy requested - - - -  Would patient like information on creating a medical advance directive? - No - Patient declined - - -    Current Medications (verified) Outpatient Encounter Medications as of 04/14/2020  Medication Sig  . acetaminophen (TYLENOL) 500 MG tablet Take 500 mg by mouth every 6 (six) hours as needed.  Marland Kitchen aspirin 81 MG tablet Take 81 mg by mouth daily.  . calcium-vitamin D (OSCAL WITH D) 500-200 MG-UNIT tablet Take  by mouth daily with breakfast.   . dextromethorphan-guaiFENesin (MUCINEX DM) 30-600 MG 12hr tablet Take 1 tablet by mouth 2 (two) times daily as needed for cough.  . fluticasone (FLONASE) 50 MCG/ACT nasal spray SPRAY 2 SPRAYS INTO EACH NOSTRIL EVERY DAY  . Garlic 10 MG CAPS Take by mouth.  . Multiple Vitamin (MULTI-VITAMIN) tablet Take 1 tablet by mouth daily.   . Cholecalciferol (VITAMIN D3) 5000 UNITS TABS Take by mouth. (Patient not taking: Reported on 04/14/2020)  . ibuprofen (ADVIL,MOTRIN) 200 MG tablet Take 200 mg by mouth every 6 (six) hours as needed. (Patient not taking: Reported on 04/14/2020)  . meloxicam (MOBIC) 7.5 MG tablet TAKE 1 TABLET BY MOUTH EVERY DAY (Patient not taking: Reported on 12/18/2019)   No facility-administered encounter medications on file as of 04/14/2020.    Allergies (verified) Patient has no known allergies.   History: Past Medical History:  Diagnosis Date  . Arthritis   . Leg cramps    right leg   Past Surgical History:  Procedure Laterality Date  . ABDOMINAL HYSTERECTOMY     partial. pt reports she has cervix   Family History  Problem Relation Age of Onset  . Hypertension Mother   . Lung cancer Mother   . Colon cancer Father   . Hypertension Sister   . Hypertension Sister   . Breast cancer Neg Hx    Social History   Socioeconomic History  . Marital status: Widowed    Spouse name: Not on file  . Number of children: 1  . Years of education: College  .  Highest education level: Some college, no degree  Occupational History  . Occupation: Full-Time    Employer: [employer]  Tobacco Use  . Smoking status: Never Smoker  . Smokeless tobacco: Never Used  Vaping Use  . Vaping Use: Never used  Substance and Sexual Activity  . Alcohol use: No    Alcohol/week: 0.0 standard drinks  . Drug use: No  . Sexual activity: Never  Other Topics Concern  . Not on file  Social History Narrative  . Not on file   Social Determinants of Health   Financial  Resource Strain: Low Risk   . Difficulty of Paying Living Expenses: Not hard at all  Food Insecurity: No Food Insecurity  . Worried About Charity fundraiser in the Last Year: Never true  . Ran Out of Food in the Last Year: Never true  Transportation Needs: No Transportation Needs  . Lack of Transportation (Medical): No  . Lack of Transportation (Non-Medical): No  Physical Activity: Inactive  . Days of Exercise per Week: 0 days  . Minutes of Exercise per Session: 0 min  Stress: No Stress Concern Present  . Feeling of Stress : Not at all  Social Connections: Socially Isolated  . Frequency of Communication with Friends and Family: Twice a week  . Frequency of Social Gatherings with Friends and Family: More than three times a week  . Attends Religious Services: Never  . Active Member of Clubs or Organizations: No  . Attends Archivist Meetings: Never  . Marital Status: Widowed    Tobacco Counseling Counseling given: Not Answered   Clinical Intake:  Pre-visit preparation completed: Yes  Pain : No/denies pain     Nutritional Risks: None Diabetes: No  How often do you need to have someone help you when you read instructions, pamphlets, or other written materials from your doctor or pharmacy?: 1 - Never  Diabetic? No  Interpreter Needed?: No  Information entered by :: [facility], LPN   Activities of Daily Living In your present state of health, do you have any difficulty performing the following activities: 04/14/2020 12/18/2019  Hearing? N N  Vision? N N  Difficulty concentrating or making decisions? N N  Walking or climbing stairs? N N  Dressing or bathing? N N  Doing errands, shopping? N N  Preparing Food and eating ? N -  Using the Toilet? N -  In the past six months, have you accidently leaked urine? N -  Do you have problems with loss of bowel control? N -  Managing your Medications? N -  Managing your Finances? N -  Housekeeping or managing your  Housekeeping? N -  Some recent data might be hidden    Patient Care Team: Paulene Floor as PCP - General (Physician Assistant) Lorelee Cover., MD (Ophthalmology) Bonner Puna, MD as Referring Physician (Specialist) Wonda Olds (Physician Assistant)  Indicate any recent Medical Services you may have received from other than [facility] providers in the past year (date may be approximate).     Assessment:   This is a routine wellness examination for Nicole Jensen.  Hearing/Vision screen No exam data present  Dietary issues and exercise activities discussed: Current Exercise Habits: The patient does not participate in regular exercise at present, Exercise limited by: None identified  Goals    . DIET - INCREASE WATER INTAKE     Recommend to drink at least 6-8 8oz glasses of water per day.  Depression Screen PHQ 2/9 Scores 12/18/2019 02/04/2017 08/24/2015  PHQ - 2 Score 0 0 0  PHQ- 9 Score 0 0 -    Fall Risk Fall Risk  04/14/2020 12/18/2019 08/24/2015  Falls in the past year? 0 0 No  Number falls in past yr: 0 0 -  Injury with Fall? 0 0 -    Any stairs in or around the home? Yes  If so, are there any without handrails? No  Home free of loose throw rugs in walkways, pet beds, electrical cords, etc? Yes  Adequate lighting in your home to reduce risk of falls? Yes   ASSISTIVE DEVICES UTILIZED TO PREVENT FALLS:  Life alert? No  Use of a cane, walker or w/c? No  Grab bars in the bathroom? No  Shower chair or bench in shower? No  Elevated toilet seat or a handicapped toilet? No    Cognitive Function: Declined today.        Immunizations Immunization History  Administered Date(s) Administered  . Influenza Split 09/01/2010  . PFIZER SARS-COV-2 Vaccination 09/11/2019, 10/02/2019  . PPD Test 02/23/2019  . Tdap 12/02/2008, 12/18/2019    TDAP status: Up to date Flu Vaccine status: Declined, Education has been provided regarding the importance of this vaccine but  patient still declined. Advised may receive this vaccine at local pharmacy or Health Dept. Aware to provide a copy of the vaccination record if obtained from local pharmacy or Health Dept. Verbalized acceptance and understanding. Pneumococcal vaccine status: Declined,  Education has been provided regarding the importance of this vaccine but patient still declined. Advised may receive this vaccine at local pharmacy or Health Dept. Aware to provide a copy of the vaccination record if obtained from local pharmacy or Health Dept. Verbalized acceptance and understanding.  Covid-19 vaccine status: Completed vaccines  Qualifies for Shingles Vaccine? Yes   Zostavax completed No   Shingrix Completed?: No.    Education has been provided regarding the importance of this vaccine. Patient has been advised to call insurance company to determine out of pocket expense if they have not yet received this vaccine. Advised may also receive vaccine at local pharmacy or Health Dept. Verbalized acceptance and understanding.  Screening Tests Health Maintenance  Topic Date Due  . Hepatitis C Screening  Never done  . MAMMOGRAM  02/27/2019  . INFLUENZA VACCINE  10/13/2020 (Originally 02/14/2020)  . PNA vac Low Risk Adult (1 of 2 - PCV13) 12/17/2020 (Originally 05/09/2017)  . COLONOSCOPY  07/12/2026  . TETANUS/TDAP  12/17/2029  . DEXA SCAN  Completed  . COVID-19 Vaccine  Completed    Health Maintenance  Health Maintenance Due  Topic Date Due  . Hepatitis C Screening  Never done  . MAMMOGRAM  02/27/2019    Colorectal cancer screening: Completed 07/12/16. Repeat every 10 years Mammogram status: Ordered 12/18/19. Pt provided with contact info and advised to call to schedule appt.  Bone Density status: Completed 09/18/12. Results reflect: Previous DEXA scan was normal. No repeat needed unless advised by a physician.  Lung Cancer Screening: (Low Dose CT Chest recommended if Age 16-80 years, 30 pack-year currently smoking  OR have quit w/in 15years.) does not qualify.   Additional Screening:  Hepatitis C Screening: does qualify and would like this added to next in office apt.  Vision Screening: Recommended annual ophthalmology exams for early detection of glaucoma and other disorders of the eye. Is the patient up to date with their annual eye exam?  Yes  Who is the provider  or what is the name of the office in which the patient attends annual eye exams? Dr Gloriann Loan If pt is not established with a provider, would they like to be referred to a provider to establish care? No .   Dental Screening: Recommended annual dental exams for proper oral hygiene  Community Resource Referral / Chronic Care Management: CRR required this visit?  No   CCM required this visit?  No      Plan:     I have personally reviewed and noted the following in the patient's chart:   . Medical and social history . Use of alcohol, tobacco or illicit drugs  . Current medications and supplements . Functional ability and status . Nutritional status . Physical activity . Advanced directives . List of other physicians . Hospitalizations, surgeries, and ER visits in previous 12 months . Vitals . Screenings to include cognitive, depression, and falls . Referrals and appointments  In addition, I have reviewed and discussed with patient certain preventive protocols, quality metrics, and best practice recommendations. A written personalized care plan for preventive services as well as general preventive health recommendations were provided to patient.     Nicole Jensen, [place]   9/87/2158   Nurse Notes: Pt would like the Hep C lab order added to next in office blood work orders. Pt plans to call and schedule mammogram this year. Pt declined receiving a future flu or pneumonia vaccine.

## 2020-04-14 ENCOUNTER — Ambulatory Visit (INDEPENDENT_AMBULATORY_CARE_PROVIDER_SITE_OTHER): Payer: BC Managed Care – PPO

## 2020-04-14 ENCOUNTER — Other Ambulatory Visit: Payer: Self-pay

## 2020-04-14 DIAGNOSIS — Z0000 Encounter for general adult medical examination without abnormal findings: Secondary | ICD-10-CM | POA: Diagnosis not present

## 2020-04-14 NOTE — Progress Notes (Deleted)
 {  This patient's chart is due for periodic physician review. Please check 'Cosign Required' and forward to your supervising physician.:1}  Established patient visit   Patient: Nicole Jensen   DOB: 08-12-1950   69 y.o. Female  MRN: [id number] Visit Date: 04/15/2020  Today's healthcare provider: Trinna Post, PA-C   No chief complaint on file.  Subjective    Leg Pain     ***  {Show patient history (optional):23778::" "}   Medications: Outpatient Medications Prior to Visit  Medication Sig  . acetaminophen (TYLENOL) 500 MG tablet Take 500 mg by mouth every 6 (six) hours as needed.  Marland Kitchen aspirin 81 MG tablet Take 81 mg by mouth daily.  . calcium-vitamin D (OSCAL WITH D) 500-200 MG-UNIT tablet Take by mouth daily with breakfast.   . Cholecalciferol (VITAMIN D3) 5000 UNITS TABS Take by mouth. (Patient not taking: Reported on 04/14/2020)  . dextromethorphan-guaiFENesin (MUCINEX DM) 30-600 MG 12hr tablet Take 1 tablet by mouth 2 (two) times daily as needed for cough.  . fluticasone (FLONASE) 50 MCG/ACT nasal spray SPRAY 2 SPRAYS INTO EACH NOSTRIL EVERY DAY  . Garlic 10 MG CAPS Take by mouth.  Marland Kitchen ibuprofen (ADVIL,MOTRIN) 200 MG tablet Take 200 mg by mouth every 6 (six) hours as needed. (Patient not taking: Reported on 04/14/2020)  . meloxicam (MOBIC) 7.5 MG tablet TAKE 1 TABLET BY MOUTH EVERY DAY (Patient not taking: Reported on 12/18/2019)  . Multiple Vitamin (MULTI-VITAMIN) tablet Take 1 tablet by mouth daily.    No facility-administered medications prior to visit.    Review of Systems  {Heme  Chem  Endocrine  Serology  Results Review (optional):23779::" "}  Objective    LMP  (LMP Unknown) Comment: partial hysterectomy {Show previous vital signs (optional):23777::" "}  Physical Exam  ***  No results found for any visits on 04/15/20.  Assessment & Plan     ***  No follow-ups on file.      {provider attestation***:1}   Paulene Floor  [facility] [phone] (phone) [phone] (fax)  [place]

## 2020-04-14 NOTE — Patient Instructions (Addendum)
Nicole Jensen , Thank you for taking time to come for your Medicare Wellness Visit. I appreciate your ongoing commitment to your health goals. Please review the following plan we discussed and let me know if I can assist you in the future.   Screening recommendations/referrals: Colonoscopy: Up to date, due 06/2026 Mammogram: Order on file. Please call and schedule apt. Bone Density: Previous DEXA scan was normal. No repeat needed unless advised by a physician. Recommended yearly ophthalmology/optometry visit for glaucoma screening and checkup Recommended yearly dental visit for hygiene and checkup  Vaccinations: Influenza vaccine: Currently due. Tdap vaccine: Up to date, due 12/2029 Shingles vaccine: Shingrix discussed. Please contact your pharmacy for coverage information.     Advanced directives: Please bring a copy of your POA (Power of Attorney) and/or Living Will to your next appointment.   Conditions/risks identified: Recommend to drink at least 6-8 8oz glasses of water per day.  Next appointment: 12/22/19 @ 9:00 AM with [place] 68 Years and Older, Female Preventive care refers to lifestyle choices and visits with your health care provider that can promote health and wellness. What does preventive care include?  A yearly physical exam. This is also called an annual well check.  Dental exams once or twice a year.  Routine eye exams. Ask your health care provider how often you should have your eyes checked.  Personal lifestyle choices, including:  Daily care of your teeth and gums.  Regular physical activity.  Eating a healthy diet.  Avoiding tobacco and drug use.  Limiting alcohol use.  Practicing safe sex.  Taking low-dose aspirin every day.  Taking vitamin and mineral supplements as recommended by your health care provider. What happens during an annual well check? The services and screenings done by your health care provider during your  annual well check will depend on your age, overall health, lifestyle risk factors, and family history of disease. Counseling  Your health care provider may ask you questions about your:  Alcohol use.  Tobacco use.  Drug use.  Emotional well-being.  Home and relationship well-being.  Sexual activity.  Eating habits.  History of falls.  Memory and ability to understand (cognition).  Work and work Statistician.  Reproductive health. Screening  You may have the following tests or measurements:  Height, weight, and BMI.  Blood pressure.  Lipid and cholesterol levels. These may be checked every 5 years, or more frequently if you are over 46 years old.  Skin check.  Lung cancer screening. You may have this screening every year starting at age 68 if you have a 30-pack-year history of smoking and currently smoke or have quit within the past 15 years.  Fecal occult blood test (FOBT) of the stool. You may have this test every year starting at age 68.  Flexible sigmoidoscopy or colonoscopy. You may have a sigmoidoscopy every 5 years or a colonoscopy every 10 years starting at age 68.  Hepatitis C blood test.  Hepatitis B blood test.  Sexually transmitted disease (STD) testing.  Diabetes screening. This is done by checking your blood sugar (glucose) after you have not eaten for a while (fasting). You may have this done every 1-3 years.  Bone density scan. This is done to screen for osteoporosis. You may have this done starting at age 68.  Mammogram. This may be done every 1-2 years. Talk to your health care provider about how often you should have regular mammograms. Talk with your health care provider  about your test results, treatment options, and if necessary, the need for more tests. Vaccines  Your health care provider may recommend certain vaccines, such as:  Influenza vaccine. This is recommended every year.  Tetanus, diphtheria, and acellular pertussis (Tdap, Td)  vaccine. You may need a Td booster every 10 years.  Zoster vaccine. You may need this after age 46.  Pneumococcal 13-valent conjugate (PCV13) vaccine. One dose is recommended after age 68.  Pneumococcal polysaccharide (PPSV23) vaccine. One dose is recommended after age 68. Talk to your health care provider about which screenings and vaccines you need and how often you need them. This information is not intended to replace advice given to you by your health care provider. Make sure you discuss any questions you have with your health care provider. Document Released: 07/29/2015 Document Revised: 03/21/2016 Document Reviewed: 05/03/2015 Elsevier Interactive Patient Education  2017 [employer] Prevention in the Home Falls can cause injuries. They can happen to people of all ages. There are many things you can do to make your home safe and to help prevent falls. What can I do on the outside of my home?  Regularly fix the edges of walkways and driveways and fix any cracks.  Remove anything that might make you trip as you walk through a door, such as a raised step or threshold.  Trim any bushes or trees on the path to your home.  Use bright outdoor lighting.  Clear any walking paths of anything that might make someone trip, such as rocks or tools.  Regularly check to see if handrails are loose or broken. Make sure that both sides of any steps have handrails.  Any raised decks and porches should have guardrails on the edges.  Have any leaves, snow, or ice cleared regularly.  Use sand or salt on walking paths during winter.  Clean up any spills in your garage right away. This includes oil or grease spills. What can I do in the bathroom?  Use night lights.  Install grab bars by the toilet and in the tub and shower. Do not use towel bars as grab bars.  Use non-skid mats or decals in the tub or shower.  If you need to sit down in the shower, use a plastic, non-slip  stool.  Keep the floor dry. Clean up any water that spills on the floor as soon as it happens.  Remove soap buildup in the tub or shower regularly.  Attach bath mats securely with double-sided non-slip rug tape.  Do not have throw rugs and other things on the floor that can make you trip. What can I do in the bedroom?  Use night lights.  Make sure that you have a light by your bed that is easy to reach.  Do not use any sheets or blankets that are too big for your bed. They should not hang down onto the floor.  Have a firm chair that has side arms. You can use this for support while you get dressed.  Do not have throw rugs and other things on the floor that can make you trip. What can I do in the kitchen?  Clean up any spills right away.  Avoid walking on wet floors.  Keep items that you use a lot in easy-to-reach places.  If you need to reach something above you, use a strong step stool that has a grab bar.  Keep electrical cords out of the way.  Do not use floor polish or  wax that makes floors slippery. If you must use wax, use non-skid floor wax.  Do not have throw rugs and other things on the floor that can make you trip. What can I do with my stairs?  Do not leave any items on the stairs.  Make sure that there are handrails on both sides of the stairs and use them. Fix handrails that are broken or loose. Make sure that handrails are as long as the stairways.  Check any carpeting to make sure that it is firmly attached to the stairs. Fix any carpet that is loose or worn.  Avoid having throw rugs at the top or bottom of the stairs. If you do have throw rugs, attach them to the floor with carpet tape.  Make sure that you have a light switch at the top of the stairs and the bottom of the stairs. If you do not have them, ask someone to add them for you. What else can I do to help prevent falls?  Wear shoes that:  Do not have high heels.  Have rubber bottoms.  Are  comfortable and fit you well.  Are closed at the toe. Do not wear sandals.  If you use a stepladder:  Make sure that it is fully opened. Do not climb a closed stepladder.  Make sure that both sides of the stepladder are locked into place.  Ask someone to hold it for you, if possible.  Clearly mark and make sure that you can see:  Any grab bars or handrails.  First and last steps.  Where the edge of each step is.  Use tools that help you move around (mobility aids) if they are needed. These include:  Canes.  Walkers.  Scooters.  Crutches.  Turn on the lights when you go into a dark area. Replace any light bulbs as soon as they burn out.  Set up your furniture so you have a clear path. Avoid moving your furniture around.  If any of your floors are uneven, fix them.  If there are any pets around you, be aware of where they are.  Review your medicines with your doctor. Some medicines can make you feel dizzy. This can increase your chance of falling. Ask your doctor what other things that you can do to help prevent falls. This information is not intended to replace advice given to you by your health care provider. Make sure you discuss any questions you have with your health care provider. Document Released: 04/28/2009 Document Revised: 12/08/2015 Document Reviewed: 08/06/2014 Elsevier Interactive Patient Education  2017 [employer].

## 2020-04-15 ENCOUNTER — Ambulatory Visit: Payer: BC Managed Care – PPO | Admitting: Physician Assistant

## 2020-05-19 ENCOUNTER — Other Ambulatory Visit: Payer: Self-pay | Admitting: Physician Assistant

## 2020-05-19 DIAGNOSIS — J309 Allergic rhinitis, unspecified: Secondary | ICD-10-CM

## 2020-08-17 ENCOUNTER — Other Ambulatory Visit: Payer: Self-pay | Admitting: Physician Assistant

## 2020-08-17 DIAGNOSIS — J309 Allergic rhinitis, unspecified: Secondary | ICD-10-CM

## 2020-09-09 ENCOUNTER — Ambulatory Visit
Admission: RE | Admit: 2020-09-09 | Discharge: 2020-09-09 | Disposition: A | Discharge status: Home / Self Care | Payer: BC Managed Care – PPO | Source: Ambulatory Visit | Attending: Physician Assistant | Admitting: Physician Assistant

## 2020-09-09 ENCOUNTER — Other Ambulatory Visit: Payer: Self-pay

## 2020-09-09 DIAGNOSIS — Z1231 Encounter for screening mammogram for malignant neoplasm of breast: Secondary | ICD-10-CM | POA: Insufficient documentation

## 2020-12-21 ENCOUNTER — Encounter: Payer: Self-pay | Admitting: Physician Assistant

## 2021-02-14 ENCOUNTER — Encounter: Payer: Self-pay | Admitting: Family Medicine

## 2021-02-14 ENCOUNTER — Other Ambulatory Visit: Payer: Self-pay

## 2021-02-14 ENCOUNTER — Ambulatory Visit (INDEPENDENT_AMBULATORY_CARE_PROVIDER_SITE_OTHER): Payer: Medicare Other | Admitting: Family Medicine

## 2021-02-14 VITALS — BP 149/73 | HR 66 | Temp 97.9°F | Ht 64.0 in | Wt 190.0 lb

## 2021-02-14 DIAGNOSIS — Z86010 Personal history of colonic polyps: Secondary | ICD-10-CM | POA: Diagnosis not present

## 2021-02-14 DIAGNOSIS — E7800 Pure hypercholesterolemia, unspecified: Secondary | ICD-10-CM | POA: Diagnosis not present

## 2021-02-14 DIAGNOSIS — D649 Anemia, unspecified: Secondary | ICD-10-CM

## 2021-02-14 DIAGNOSIS — R7303 Prediabetes: Secondary | ICD-10-CM

## 2021-02-14 DIAGNOSIS — Z0000 Encounter for general adult medical examination without abnormal findings: Secondary | ICD-10-CM

## 2021-02-14 NOTE — Progress Notes (Signed)
Complete physical exam   Patient: Nicole Jensen   DOB: [birth year]   70 y.o. Female  MRN: [id number] Visit Date: 02/14/2021  Today's healthcare provider: Lavon Paganini, MD   Chief Complaint  Patient presents with   Annual Exam   Subjective    ZYIONA PANCAKE is a 70 y.o. female who presents today for a complete physical exam.  She reports consuming a  poor  diet.     She generally feels well. She reports sleeping well. She does have additional problems to discuss today.   Vaccine  She is current on COVID and eligible for the 4th booster. She is eligible for pneumonia and shingrix vaccine.   Screenings Colonoscopy 07/12/16- multiple tubular adenomas.  She is amenable to receiving a referral to GI for a colon screening.  Mammogram 09/13/20  Blood work History of gastric sleeve in 2019. She was slightly anemic at this time and needs current blood work.   Foot cramping  She experiences a tightness/cramping in her feet. This is a chronic issues that is more prevalent throughout the night. Occasionally when she is working in the yard she will have some cramping.   Past Medical History:  Diagnosis Date   Arthritis    Leg cramps    right leg   Past Surgical History:  Procedure Laterality Date   ABDOMINAL HYSTERECTOMY     partial. pt reports she has cervix   Social History   Socioeconomic History   Marital status: Widowed    Spouse name: Not on file   Number of children: 1   Years of education: College   Highest education level: Some college, no degree  Occupational History   Occupation: Programme researcher, broadcasting/film/video: [employer]  Tobacco Use   Smoking status: Never   Smokeless tobacco: Never  Vaping Use   Vaping Use: Never used  Substance and Sexual Activity   Alcohol use: No    Alcohol/week: 0.0 standard drinks   Drug use: No   Sexual activity: Never  Other Topics Concern   Not on file  Social History Narrative   Not on file   Social Determinants of Health    Financial Resource Strain: Low Risk    Difficulty of Paying Living Expenses: Not hard at all  Food Insecurity: No Food Insecurity   Worried About Charity fundraiser in the Last Year: Never true   [place] in the Last Year: Never true  Transportation Needs: No Transportation Needs   Lack of Transportation (Medical): No   Lack of Transportation (Non-Medical): No  Physical Activity: Inactive   Days of Exercise per Week: 0 days   Minutes of Exercise per Session: 0 min  Stress: No Stress Concern Present   Feeling of Stress : Not at all  Social Connections: Socially Isolated   Frequency of Communication with Friends and Family: Twice a week   Frequency of Social Gatherings with Friends and Family: More than three times a week   Attends Religious Services: Never   Marine scientist or Organizations: No   Attends Archivist Meetings: Never   Marital Status: Widowed  Human resources officer Violence: Not At Risk   Fear of Current or Ex-Partner: No   Emotionally Abused: No   Physically Abused: No   Sexually Abused: No   Family Status  Relation Name Status   Mother  Deceased at age 8   Father  Deceased at age 17's   Sister  Alive   Brother  Deceased   Brother  Deceased   Sister  Alive   Sister  Alive   Sister  Alive   Brother  Alive   Brother  Alive   Neg Hx  (Not Specified)   Family History  Problem Relation Age of Onset   Hypertension Mother    Lung cancer Mother    Colon cancer Father    Hypertension Sister    Hypertension Sister    Breast cancer Neg Hx    No Known Allergies  Patient Care Team: Claud Gowan, Dionne Bucy, MD as PCP - General (Family Medicine) Lorelee Cover., MD (Ophthalmology) Bonner Puna, MD as Referring Physician (Specialist) Wonda Olds (Physician Assistant)   Medications: Outpatient Medications Prior to Visit  Medication Sig   acetaminophen (TYLENOL) 500 MG tablet Take 500 mg by mouth every 6 (six) hours as needed.    aspirin 81 MG tablet Take 81 mg by mouth daily.   calcium-vitamin D (OSCAL WITH D) 500-200 MG-UNIT tablet Take by mouth daily with breakfast.    fluticasone (FLONASE) 50 MCG/ACT nasal spray SPRAY 2 SPRAYS INTO EACH NOSTRIL EVERY DAY   Garlic 10 MG CAPS Take by mouth.   Multiple Vitamin (MULTI-VITAMIN) tablet Take 1 tablet by mouth daily.    [DISCONTINUED] Cholecalciferol (VITAMIN D3) 5000 UNITS TABS Take by mouth. (Patient not taking: No sig reported)   [DISCONTINUED] dextromethorphan-guaiFENesin (MUCINEX DM) 30-600 MG 12hr tablet Take 1 tablet by mouth 2 (two) times daily as needed for cough. (Patient not taking: Reported on 02/14/2021)   [DISCONTINUED] ibuprofen (ADVIL,MOTRIN) 200 MG tablet Take 200 mg by mouth every 6 (six) hours as needed. (Patient not taking: No sig reported)   [DISCONTINUED] meloxicam (MOBIC) 7.5 MG tablet TAKE 1 TABLET BY MOUTH EVERY DAY (Patient not taking: No sig reported)   No facility-administered medications prior to visit.    Review of Systems  Constitutional: Negative.  Negative for chills, diaphoresis, fatigue and fever.  HENT: Negative.  Negative for ear pain, sinus pressure, sinus pain and sore throat.   Eyes: Negative.  Negative for pain and visual disturbance.  Respiratory: Negative.  Negative for cough, chest tightness, shortness of breath and wheezing.   Cardiovascular: Negative.  Negative for chest pain, palpitations and leg swelling.  Gastrointestinal: Negative.  Negative for abdominal pain, blood in stool, constipation, diarrhea, nausea and vomiting.  Endocrine: Negative.   Genitourinary: Negative.  Negative for dysuria, flank pain, frequency, pelvic pain and urgency.  Musculoskeletal:  Positive for myalgias. Negative for back pain and neck pain.  Skin: Negative.   Allergic/Immunologic: Negative.   Neurological:  Positive for numbness. Negative for dizziness, seizures, syncope, weakness, light-headedness and headaches.  Hematological: Negative.    Psychiatric/Behavioral: Negative.       Objective    BP (!) 149/73   Pulse 66   Temp 97.9 F (36.6 C)   Ht '5\' 4"'$  (1.626 m)   Wt 190 lb (86.2 kg)   LMP  (LMP Unknown) Comment: partial hysterectomy  BMI 32.61 kg/m    Physical Exam Vitals reviewed.  Constitutional:      General: She is not in acute distress.    Appearance: Normal appearance. She is well-developed. She is not diaphoretic.  HENT:     Head: Normocephalic and atraumatic.     Right Ear: Tympanic membrane, ear canal and external ear normal.     Left Ear: Tympanic membrane, ear canal and external ear normal.     Nose: Nose  normal.     Mouth/Throat:     Mouth: Mucous membranes are moist.     Pharynx: Oropharynx is clear. No oropharyngeal exudate.  Eyes:     General: No scleral icterus.    Conjunctiva/sclera: Conjunctivae normal.     Pupils: Pupils are equal, round, and reactive to light.  Neck:     Thyroid: No thyromegaly.  Cardiovascular:     Rate and Rhythm: Normal rate and regular rhythm.     Pulses: Normal pulses.     Heart sounds: Normal heart sounds. No murmur heard. Pulmonary:     Effort: Pulmonary effort is normal. No respiratory distress.     Breath sounds: Normal breath sounds. No wheezing or rales.  Abdominal:     General: There is no distension.     Palpations: Abdomen is soft.     Tenderness: no abdominal tenderness  Musculoskeletal:        General: No deformity.     Cervical back: Neck supple.     Right lower leg: No edema.     Left lower leg: No edema.  Lymphadenopathy:     Cervical: No cervical adenopathy.  Skin:    General: Skin is warm and dry.     Findings: No rash.  Neurological:     Mental Status: She is alert and oriented to person, place, and time. Mental status is at baseline.     Sensory: No sensory deficit.     Motor: No weakness.     Gait: Gait normal.  Psychiatric:        Mood and Affect: Mood normal.        Behavior: Behavior normal.        Thought Content: Thought  content normal.     Last depression screening scores PHQ 2/9 Scores 12/18/2019 02/04/2017 08/24/2015  PHQ - 2 Score 0 0 0  PHQ- 9 Score 0 0 -   Last fall risk screening Fall Risk  04/14/2020  Falls in the past year? 0  Number falls in past yr: 0  Injury with Fall? 0   Last Audit-C alcohol use screening Alcohol Use Disorder Test (AUDIT) 04/14/2020  1. How often do you have a drink containing alcohol? 0  2. How many drinks containing alcohol do you have on a typical day when you are drinking? 0  3. How often do you have six or more drinks on one occasion? 0  AUDIT-C Score 0  Alcohol Brief Interventions/Follow-up AUDIT Score <7 follow-up not indicated   A score of 3 or more in women, and 4 or more in men indicates increased risk for alcohol abuse, EXCEPT if all of the points are from question 1   No results found for any visits on 02/14/21.  Assessment & Plan     Problem List Items Addressed This Visit       Other   Hypercholesterolemia    Reviewed last lipid panel Not currently on a statin Recheck FLP and CMP Discussed diet and exercise        Relevant Orders   Comprehensive metabolic panel   Lipid panel   Prediabetes    Recommend low carb diet Recheck A1c       Relevant Orders   Hemoglobin A1c   Other Visit Diagnoses     Encounter for annual physical exam    -  Primary   Relevant Orders   Comprehensive metabolic panel   Lipid panel   Hemoglobin A1c   CBC w/Diff/Platelet  History of colon polyps       Relevant Orders   Ambulatory referral to Gastroenterology   Anemia, unspecified type       Relevant Orders   CBC w/Diff/Platelet      Routine Health Maintenance and Physical Exam  Exercise Activities and Dietary recommendations  Goals      DIET - INCREASE WATER INTAKE     Recommend to drink at least 6-8 8oz glasses of water per day.        Immunization History  Administered Date(s) Administered   Influenza Split 09/01/2010   PFIZER(Purple  Top)SARS-COV-2 Vaccination 09/11/2019, 10/02/2019   PPD Test 02/23/2019   Tdap 12/02/2008, 12/18/2019    Health Maintenance  Topic Date Due   Hepatitis C Screening  Never done   Zoster Vaccines- Shingrix (1 of 2) Never done   PNA vac Low Risk Adult (1 of 2 - PCV13) Never done   COVID-19 Vaccine (3 - Booster for Pfizer series) 03/03/2020   INFLUENZA VACCINE  02/13/2021   MAMMOGRAM  09/09/2022   COLONOSCOPY (Pts 45-17yr Insurance coverage will need to be confirmed)  07/12/2026   TETANUS/TDAP  12/17/2029   DEXA SCAN  Completed   HPV VACCINES  Aged Out    Discussed health benefits of physical activity, and encouraged her to engage in regular exercise appropriate for her age and condition.   Return in about 1 year (around 02/14/2022) for CPE, AWV.     I,Essence Turner,acting as a sEducation administratorfor ALavon Paganini MD.,have documented all relevant documentation on the behalf of ALavon Paganini MD,as directed by  ALavon Paganini MD while in the presence of ALavon Paganini MD.  I, ALavon Paganini MD, have reviewed all documentation for this visit. The documentation on 02/14/21 for the exam, diagnosis, procedures, and orders are all accurate and complete.   Melaina Howerton, ADionne Bucy MD, MPH [email or web address]

## 2021-02-14 NOTE — Assessment & Plan Note (Signed)
Reviewed last lipid panel Not currently on a statin Recheck FLP and CMP Discussed diet and exercise  

## 2021-02-14 NOTE — Assessment & Plan Note (Signed)
Recommend low carb diet °Recheck A1c  °

## 2021-02-14 NOTE — Patient Instructions (Signed)
The CDC recommends two doses of Shingrix (the shingles vaccine) separated by 2 to 6 months for adults age 70 years and older. I recommend checking with your insurance plan regarding coverage for this vaccine.    Think about pneumonia shot

## 2021-02-20 ENCOUNTER — Other Ambulatory Visit: Payer: Self-pay

## 2021-02-20 MED ORDER — NA SULFATE-K SULFATE-MG SULF 17.5-3.13-1.6 GM/177ML PO SOLN: 1.0000 | ORAL | 0 refills | Status: DC

## 2021-02-21 LAB — COMPREHENSIVE METABOLIC PANEL
ALT: 15 IU/L (ref 0–32)
AST: 19 IU/L (ref 0–40)
Albumin/Globulin Ratio: 1.2 (ref 1.2–2.2)
Albumin: 3.7 g/dL — ABNORMAL LOW (ref 3.8–4.8)
Alkaline Phosphatase: 101 IU/L (ref 44–121)
BUN/Creatinine Ratio: 19 (ref 12–28)
BUN: 15 mg/dL (ref 8–27)
Bilirubin Total: 0.3 mg/dL (ref 0.0–1.2)
CO2: 20 mmol/L (ref 20–29)
Calcium: 9.2 mg/dL (ref 8.7–10.3)
Chloride: 108 mmol/L — ABNORMAL HIGH (ref 96–106)
Creatinine, Ser: 0.81 mg/dL (ref 0.57–1.00)
Globulin, Total: 3.2 g/dL (ref 1.5–4.5)
Glucose: 79 mg/dL (ref 65–99)
Potassium: 4.2 mmol/L (ref 3.5–5.2)
Sodium: 142 mmol/L (ref 134–144)
Total Protein: 6.9 g/dL (ref 6.0–8.5)
eGFR: 79 mL/min/{1.73_m2} (ref >59)

## 2021-02-21 LAB — CBC WITH DIFFERENTIAL/PLATELET
Basophils Absolute: 0 10*3/uL (ref 0.0–0.2)
Basos: 0 %
EOS (ABSOLUTE): 0.1 10*3/uL (ref 0.0–0.4)
Eos: 3 %
Hematocrit: 36.4 % (ref 34.0–46.6)
Hemoglobin: 11.9 g/dL (ref 11.1–15.9)
Immature Grans (Abs): 0 10*3/uL (ref 0.0–0.1)
Immature Granulocytes: 0 %
Lymphocytes Absolute: 1.6 10*3/uL (ref 0.7–3.1)
Lymphs: 46 %
MCH: 27.3 pg (ref 26.6–33.0)
MCHC: 32.7 g/dL (ref 31.5–35.7)
MCV: 84 fL (ref 79–97)
Monocytes Absolute: 0.4 10*3/uL (ref 0.1–0.9)
Monocytes: 10 %
Neutrophils Absolute: 1.4 10*3/uL (ref 1.4–7.0)
Neutrophils: 41 %
Platelets: 184 10*3/uL (ref 150–450)
RBC: 4.36 x10E6/uL (ref 3.77–5.28)
RDW: 13.8 % (ref 11.7–15.4)
WBC: 3.4 10*3/uL (ref 3.4–10.8)

## 2021-02-21 LAB — LIPID PANEL
Chol/HDL Ratio: 2.7 ratio (ref 0.0–4.4)
Cholesterol, Total: 211 mg/dL — ABNORMAL HIGH (ref 100–199)
HDL: 78 mg/dL (ref >39)
LDL Chol Calc (NIH): 125 mg/dL — ABNORMAL HIGH (ref 0–99)
Triglycerides: 47 mg/dL (ref 0–149)
VLDL Cholesterol Cal: 8 mg/dL (ref 5–40)

## 2021-02-21 LAB — HEMOGLOBIN A1C
Est. average glucose Bld gHb Est-mCnc: 114 mg/dL
Hgb A1c MFr Bld: 5.6 % (ref 4.8–5.6)

## 2021-02-24 ENCOUNTER — Other Ambulatory Visit: Payer: Self-pay | Admitting: Gastroenterology

## 2021-02-27 ENCOUNTER — Telehealth: Payer: Self-pay

## 2021-02-27 MED ORDER — ROSUVASTATIN CALCIUM 5 MG PO TABS: 5.0000 mg | ORAL_TABLET | Freq: Every day | ORAL | 3 refills | Status: DC

## 2021-02-27 NOTE — Telephone Encounter (Signed)
-----   Message from [place] sent at 02/23/2021  1:58 PM EDT ----- Call unable to go through If patient calls PEC triage may give results

## 2021-03-06 ENCOUNTER — Encounter: Payer: Self-pay | Admitting: Gastroenterology

## 2021-03-06 ENCOUNTER — Telehealth: Payer: Self-pay

## 2021-03-06 NOTE — Telephone Encounter (Signed)
Patient called endo because she had question about her Golytely prep. Informed patient that she would fill to the fill line with water or Gatorade and mix together. Could not be red or purple. Then at 5pm she will start drink 8oz every 20 to 30 minutes till solution is gone. Reminded patient clear liquids all day today. Patient verbalized understanding

## 2021-03-07 ENCOUNTER — Encounter: Payer: Self-pay | Admitting: Gastroenterology

## 2021-03-07 ENCOUNTER — Ambulatory Visit: Payer: BC Managed Care – PPO | Admitting: Registered Nurse

## 2021-03-07 ENCOUNTER — Ambulatory Visit
Admission: RE | Admit: 2021-03-07 | Discharge: 2021-03-07 | Disposition: A | Discharge status: Home / Self Care | Payer: BC Managed Care – PPO | Attending: Gastroenterology | Admitting: Gastroenterology

## 2021-03-07 ENCOUNTER — Encounter
Admission: RE | Disposition: A | Discharge status: Home / Self Care | Payer: Self-pay | Source: Home / Self Care | Attending: Gastroenterology

## 2021-03-07 DIAGNOSIS — Z801 Family history of malignant neoplasm of trachea, bronchus and lung: Secondary | ICD-10-CM | POA: Insufficient documentation

## 2021-03-07 DIAGNOSIS — D123 Benign neoplasm of transverse colon: Secondary | ICD-10-CM | POA: Diagnosis not present

## 2021-03-07 DIAGNOSIS — Z90711 Acquired absence of uterus with remaining cervical stump: Secondary | ICD-10-CM | POA: Diagnosis not present

## 2021-03-07 DIAGNOSIS — D122 Benign neoplasm of ascending colon: Secondary | ICD-10-CM | POA: Diagnosis not present

## 2021-03-07 DIAGNOSIS — Z9884 Bariatric surgery status: Secondary | ICD-10-CM | POA: Insufficient documentation

## 2021-03-07 DIAGNOSIS — Z1211 Encounter for screening for malignant neoplasm of colon: Secondary | ICD-10-CM | POA: Insufficient documentation

## 2021-03-07 DIAGNOSIS — K5730 Diverticulosis of large intestine without perforation or abscess without bleeding: Secondary | ICD-10-CM | POA: Diagnosis not present

## 2021-03-07 DIAGNOSIS — Z800 Family history of malignant neoplasm of digestive organs: Secondary | ICD-10-CM | POA: Insufficient documentation

## 2021-03-07 DIAGNOSIS — Z79899 Other long term (current) drug therapy: Secondary | ICD-10-CM | POA: Diagnosis not present

## 2021-03-07 DIAGNOSIS — Z7982 Long term (current) use of aspirin: Secondary | ICD-10-CM | POA: Insufficient documentation

## 2021-03-07 HISTORY — PX: COLONOSCOPY: SHX5424

## 2021-03-07 SURGERY — COLONOSCOPY
Anesthesia: General

## 2021-03-07 MED ORDER — PROPOFOL 10 MG/ML IV BOLUS
INTRAVENOUS | Status: DC | PRN
  Administered 2021-03-07: 70 mg via INTRAVENOUS

## 2021-03-07 MED ORDER — PROPOFOL 500 MG/50ML IV EMUL
INTRAVENOUS | Status: DC | PRN
  Administered 2021-03-07: 140 ug/kg/min via INTRAVENOUS

## 2021-03-07 MED ORDER — SODIUM CHLORIDE 0.9 % IV SOLN: INTRAVENOUS | Status: DC

## 2021-03-07 NOTE — Anesthesia Postprocedure Evaluation (Signed)
Anesthesia Post Note  Patient: Nicole Jensen  Procedure(s) Performed: COLONOSCOPY  Patient location during evaluation: Phase II Anesthesia Type: General Level of consciousness: awake and alert, awake and oriented Pain management: pain level controlled Vital Signs Assessment: post-procedure vital signs reviewed and stable Respiratory status: spontaneous breathing, nonlabored ventilation and respiratory function stable Cardiovascular status: blood pressure returned to baseline and stable Postop Assessment: no apparent nausea or vomiting Anesthetic complications: no   No notable events documented.   Last Vitals:  Vitals:   03/07/21 1150 03/07/21 1200  BP: 127/67 (!) 168/74  Pulse: 62 60  Resp: 17 17  Temp:    SpO2: 100% 100%    Last Pain:  Vitals:   03/07/21 1200  TempSrc:   PainSc: 0-No pain                 Phill Mutter

## 2021-03-07 NOTE — Op Note (Signed)
[facility] Gastroenterology Patient Name: Nicole Jensen Procedure Date: 03/07/2021 11:04 AM MRN: [id number] Account #: [id number] Date of Birth: 13-Feb-1951 Admit Type: Outpatient Age: 70 Room: [facility] ENDO ROOM 3 Gender: Female Note Status: Finalized Procedure:             Colonoscopy Indications:           Surveillance: Personal history of adenomatous polyps                         on last colonoscopy 5 years ago, Last colonoscopy:                         April 2005 Providers:             Lin Landsman MD, MD Referring MD:          Dionne Bucy. Bacigalupo (Referring MD) Medicines:             General Anesthesia Complications:         No immediate complications. Estimated blood loss:                         Minimal. Procedure:             Pre-Anesthesia Assessment:                        - Prior to the procedure, a History and Physical was                         performed, and patient medications and allergies were                         reviewed. The patient is competent. The risks and                         benefits of the procedure and the sedation options and                         risks were discussed with the patient. All questions                         were answered and informed consent was obtained.                         Patient identification and proposed procedure were                         verified by the physician, the nurse, the                         anesthesiologist, the anesthetist and the technician                         in the pre-procedure area in the procedure room in the                         endoscopy suite. Mental Status Examination: alert and  oriented. Airway Examination: normal oropharyngeal                         airway and neck mobility. Respiratory Examination:                         clear to auscultation. CV Examination: normal.                         Prophylactic Antibiotics: The patient does  not require                         prophylactic antibiotics. Prior Anticoagulants: The                         patient has taken no previous anticoagulant or                         antiplatelet agents. ASA Grade Assessment: II - A                         patient with mild systemic disease. After reviewing                         the risks and benefits, the patient was deemed in                         satisfactory condition to undergo the procedure. The                         anesthesia plan was to use general anesthesia.                         Immediately prior to administration of medications,                         the patient was re-assessed for adequacy to receive                         sedatives. The heart rate, respiratory rate, oxygen                         saturations, blood pressure, adequacy of pulmonary                         ventilation, and response to care were monitored                         throughout the procedure. The physical status of the                         patient was re-assessed after the procedure.                        After obtaining informed consent, the colonoscope was                         passed under direct vision. Throughout the procedure,  the patient's blood pressure, pulse, and oxygen                         saturations were monitored continuously. The                         Colonoscope was introduced through the anus and                         advanced to the the cecum, identified by appendiceal                         orifice and ileocecal valve. The colonoscopy was                         performed without difficulty. The patient tolerated                         the procedure well. The quality of the bowel                         preparation was evaluated using the BBPS [facility] Bowel                         Preparation Scale) with scores of: Right Colon = 3,                         Transverse Colon = 3 and  Left Colon = 3 (entire mucosa                         seen well with no residual staining, small fragments                         of stool or opaque liquid). The total BBPS score                         equals 9. Findings:      The perianal and digital rectal examinations were normal. Pertinent       negatives include normal sphincter tone and no palpable rectal lesions.      Five sessile polyps were found in the rectum (26m) 1, descending colon 2       and ascending colon 1. The polyps were 3 to 8 mm in size. These polyps       were removed with a cold snare. Resection and retrieval were complete.       Estimated blood loss was minimal.      The retroflexed view of the distal rectum and anal verge was normal and       showed no anal or rectal abnormalities.      Multiple diverticula were found in the entire colon. Impression:            - Five 3 to 8 mm polyps in the rectum, in the                         descending colon and in the ascending colon, removed  with a cold snare. Resected and retrieved.                        - The distal rectum and anal verge are normal on                         retroflexion view.                        - Diverticulosis in the entire examined colon. Recommendation:        - Discharge patient to home (with escort).                        - Resume previous diet today.                        - Continue present medications.                        - Await pathology results.                        - Repeat colonoscopy in 3 - 5 years for surveillance                         based on pathology results. Procedure Code(s):     --- Professional ---                        [phone], Colonoscopy, flexible; with removal of                         tumor(s), polyp(s), or other lesion(s) by snare                         technique Diagnosis Code(s):     --- Professional ---                        Z86.010, Personal history of colonic polyps                         K62.1, Rectal polyp                        K63.5, Polyp of colon                        K57.30, Diverticulosis of large intestine without                         perforation or abscess without bleeding CPT copyright 2019 American Medical Association. All rights reserved. The codes documented in this report are preliminary and upon coder review may  be revised to meet current compliance requirements. Dr. Ulyess Mort Lin Landsman MD, MD 03/07/2021 11:41:43 AM This report has been signed electronically. Number of Addenda: 0 Note Initiated On: 03/07/2021 11:04 AM Scope Withdrawal Time: 0 hours 16 minutes 22 seconds  Total Procedure Duration: 0 hours 19 minutes 29 seconds  Estimated Blood Loss:  Estimated blood loss was minimal.      [facility]

## 2021-03-07 NOTE — Transfer of Care (Signed)
Immediate Anesthesia Transfer of Care Note  Patient: Nicole Jensen  Procedure(s) Performed: COLONOSCOPY  Patient Location: PACU  Anesthesia Type:General  Level of Consciousness: sedated  Airway & Oxygen Therapy: Patient Spontanous Breathing  Post-op Assessment: Report given to RN and Post -op Vital signs reviewed and stable  Post vital signs: Reviewed and stable  Last Vitals:  Vitals Value Taken Time  BP 127/67 03/07/21 1145  Temp 36.3 C 03/07/21 1140  Pulse 63 03/07/21 1148  Resp 17 03/07/21 1148  SpO2 100 % 03/07/21 1148  Vitals shown include unvalidated device data.  Last Pain:  Vitals:   03/07/21 1140  TempSrc: Temporal  PainSc:          Complications: No notable events documented.

## 2021-03-07 NOTE — Anesthesia Preprocedure Evaluation (Signed)
Anesthesia Evaluation  Patient identified by MRN, date of birth, ID band Patient awake    Reviewed: Allergy & Precautions, NPO status , Patient's Chart, lab work & pertinent test results  Airway Mallampati: II  TM Distance: >3 FB Neck ROM: Full    Dental  (+) Missing   Pulmonary neg pulmonary ROS,    Pulmonary exam normal        Cardiovascular negative cardio ROS Normal cardiovascular exam     Neuro/Psych negative neurological ROS  negative psych ROS   GI/Hepatic negative GI ROS, Neg liver ROS,   Endo/Other  negative endocrine ROS  Renal/GU negative Renal ROS  negative genitourinary   Musculoskeletal negative musculoskeletal ROS (+)   Abdominal (+) + obese,   Peds negative pediatric ROS (+)  Hematology negative hematology ROS (+)   Anesthesia Other Findings   Reproductive/Obstetrics negative OB ROS                             Anesthesia Physical Anesthesia Plan  ASA: 2  Anesthesia Plan: General   Post-op Pain Management:    Induction: Intravenous  PONV Risk Score and Plan: 2 and TIVA and Propofol infusion  Airway Management Planned: Natural Airway and Nasal Cannula  Additional Equipment:   Intra-op Plan:   Post-operative Plan:   Informed Consent: I have reviewed the patients History and Physical, chart, labs and discussed the procedure including the risks, benefits and alternatives for the proposed anesthesia with the patient or authorized representative who has indicated his/her understanding and acceptance.       Plan Discussed with: CRNA, Surgeon and Anesthesiologist  Anesthesia Plan Comments:         Anesthesia Quick Evaluation

## 2021-03-07 NOTE — H&P (Signed)
Cephas Darby, MD [address]  [place]  [place], [place] [address]  Main: [phone]  Fax: [phone] Pager: [phone]  Primary Care Physician:  Virginia Crews, MD Primary Gastroenterologist:  Dr. Cephas Darby  Pre-Procedure History & Physical: HPI:  Nicole Jensen is a 70 y.o. female is here for an colonoscopy.   Past Medical History:  Diagnosis Date   Arthritis    Leg cramps    right leg    Past Surgical History:  Procedure Laterality Date   ABDOMINAL HYSTERECTOMY     partial. pt reports she has cervix   BARIATRIC SURGERY  2019   COLONOSCOPY WITH PROPOFOL      Prior to Admission medications   Medication Sig Start Date End Date Taking? Authorizing Provider  acetaminophen (TYLENOL) 500 MG tablet Take 500 mg by mouth every 6 (six) hours as needed.    [provider]  aspirin 81 MG tablet Take 81 mg by mouth daily.    [provider]  calcium-vitamin D (OSCAL WITH D) 500-200 MG-UNIT tablet Take by mouth daily with breakfast.     [provider]  fluticasone (FLONASE) 50 MCG/ACT nasal spray SPRAY 2 SPRAYS INTO EACH NOSTRIL EVERY DAY 08/17/20   Trinna Post, PA-C  Garlic 10 MG CAPS Take by mouth.    [provider]  Multiple Vitamin (MULTI-VITAMIN) tablet Take 1 tablet by mouth daily.     [provider]  rosuvastatin (CRESTOR) 5 MG tablet Take 1 tablet (5 mg total) by mouth daily. 02/27/21   Virginia Crews, MD    Allergies as of 02/20/2021   (No Known Allergies)    Family History  Problem Relation Age of Onset   Hypertension Mother    Lung cancer Mother    Colon cancer Father    Hypertension Sister    Hypertension Sister    Breast cancer Neg Hx     Social History   Socioeconomic History   Marital status: Widowed    Spouse name: Not on file   Number of children: 1   Years of education: College   Highest education level: Some college, no degree  Occupational History   Occupation:  Programme researcher, broadcasting/film/video: [employer]  Tobacco Use   Smoking status: Never   Smokeless tobacco: Never  Vaping Use   Vaping Use: Never used  Substance and Sexual Activity   Alcohol use: No    Alcohol/week: 0.0 standard drinks   Drug use: No   Sexual activity: Never  Other Topics Concern   Not on file  Social History Narrative   Not on file   Social Determinants of Health   Financial Resource Strain: Low Risk    Difficulty of Paying Living Expenses: Not hard at all  Food Insecurity: No Food Insecurity   Worried About Charity fundraiser in the Last Year: Never true   [place] in the Last Year: Never true  Transportation Needs: No Transportation Needs   Lack of Transportation (Medical): No   Lack of Transportation (Non-Medical): No  Physical Activity: Inactive   Days of Exercise per Week: 0 days   Minutes of Exercise per Session: 0 min  Stress: No Stress Concern Present   Feeling of Stress : Not at all  Social Connections: Socially Isolated   Frequency of Communication with Friends and Family: Twice a week   Frequency of Social Gatherings with Friends and Family: More than three times a week  Attends Religious Services: Never   Active Member of Clubs or Organizations: No   Attends Archivist Meetings: Never   Marital Status: Widowed  Human resources officer Violence: Not At Risk   Fear of Current or Ex-Partner: No   Emotionally Abused: No   Physically Abused: No   Sexually Abused: No    Review of Systems: See HPI, otherwise negative ROS  Physical Exam: BP (!) 157/81   Pulse 61   Temp (!) 97.3 F (36.3 C) (Temporal)   Resp 18   Ht '5\' 3"'$  (1.6 m)   Wt 83.5 kg   LMP  (LMP Unknown) Comment: partial hysterectomy  SpO2 100%   BMI 32.59 kg/m  General:   Alert,  pleasant and cooperative in NAD Head:  Normocephalic and atraumatic. Neck:  Supple; no masses or thyromegaly. Lungs:  Clear throughout to auscultation.    Heart:  Regular rate and rhythm. Abdomen:  Soft,  nontender and nondistended. Normal bowel sounds, without guarding, and without rebound.   Neurologic:  Alert and  oriented x4;  grossly normal neurologically.  Impression/Plan: Nicole Jensen is here for an colonoscopy to be performed for h/o tubular adenomas colon  Risks, benefits, limitations, and alternatives regarding  colonoscopy have been reviewed with the patient.  Questions have been answered.  All parties agreeable.   Sherri Sear, MD  03/07/2021, 11:07 AM

## 2021-03-08 ENCOUNTER — Encounter: Payer: Self-pay | Admitting: Gastroenterology

## 2021-03-08 LAB — SURGICAL PATHOLOGY

## 2021-03-09 ENCOUNTER — Encounter: Payer: Self-pay | Admitting: Gastroenterology

## 2021-04-20 ENCOUNTER — Ambulatory Visit: Payer: BC Managed Care – PPO

## 2021-04-21 ENCOUNTER — Other Ambulatory Visit (HOSPITAL_COMMUNITY): Payer: Self-pay

## 2021-05-20 ENCOUNTER — Other Ambulatory Visit: Payer: Self-pay

## 2021-05-20 ENCOUNTER — Emergency Department: Payer: No Typology Code available for payment source

## 2021-05-20 ENCOUNTER — Emergency Department
Admission: EM | Admit: 2021-05-20 | Discharge: 2021-05-20 | Disposition: A | Discharge status: Home / Self Care | Payer: No Typology Code available for payment source | Attending: Emergency Medicine | Admitting: Emergency Medicine

## 2021-05-20 ENCOUNTER — Encounter: Payer: Self-pay | Admitting: Emergency Medicine

## 2021-05-20 DIAGNOSIS — Z7982 Long term (current) use of aspirin: Secondary | ICD-10-CM | POA: Insufficient documentation

## 2021-05-20 DIAGNOSIS — S0083XA Contusion of other part of head, initial encounter: Secondary | ICD-10-CM

## 2021-05-20 DIAGNOSIS — S0993XA Unspecified injury of face, initial encounter: Secondary | ICD-10-CM | POA: Diagnosis present

## 2021-05-20 DIAGNOSIS — Z0471 Encounter for examination and observation following alleged adult physical abuse: Secondary | ICD-10-CM | POA: Diagnosis not present

## 2021-05-20 DIAGNOSIS — S022XXA Fracture of nasal bones, initial encounter for closed fracture: Secondary | ICD-10-CM | POA: Diagnosis not present

## 2021-05-20 DIAGNOSIS — S0990XA Unspecified injury of head, initial encounter: Secondary | ICD-10-CM

## 2021-05-20 DIAGNOSIS — M47812 Spondylosis without myelopathy or radiculopathy, cervical region: Secondary | ICD-10-CM | POA: Diagnosis not present

## 2021-05-20 DIAGNOSIS — Y9319 Activity, other involving water and watercraft: Secondary | ICD-10-CM | POA: Insufficient documentation

## 2021-05-20 DIAGNOSIS — M546 Pain in thoracic spine: Secondary | ICD-10-CM | POA: Insufficient documentation
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
## 2021-05-20 NOTE — ED Notes (Signed)
Patient stable and discharged with all personal belongings and AVS. AVS and discharge instructions reviewed with patient and opportunity for questions provided.   

## 2021-05-20 NOTE — Discharge Instructions (Signed)
Apply ice to the nose.  Follow-up with ENT in 3 to 4 days.  Return to emergency department worsening
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
## 2021-05-23 ENCOUNTER — Ambulatory Visit: Payer: Self-pay | Admitting: *Deleted

## 2021-05-23 DIAGNOSIS — H524 Presbyopia: Secondary | ICD-10-CM | POA: Diagnosis not present

## 2021-05-23 NOTE — Telephone Encounter (Signed)
Per agent: "The patient has been experiencing cough and congestion for roughly two weeks   The patient would like to be prescribed something to help with discomfort."  Attempted to reach pt, left VM to call back.

## 2021-05-23 NOTE — Telephone Encounter (Signed)
Third attempt to reach pt. Routed to practice for review, per protocol.

## 2021-05-23 NOTE — Telephone Encounter (Signed)
Patient called on mobile # [phone], left VM to return the call to the office to discuss symptoms with a nurse.  Attempted home # [phone], unable to leave VM d/t mailbox not set up.   Summary: cough and congestion   The patient has been experiencing cough and congestion for roughly two weeks   The patient would like to be prescribed something to help with discomfort   Please contact further

## 2021-05-24 ENCOUNTER — Ambulatory Visit: Payer: Self-pay | Admitting: *Deleted
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
## 2021-05-25 NOTE — Telephone Encounter (Signed)
Can offer any available appts with any provider, [facility] virtual visit, [facility] offices for acute visit.

## 2021-05-25 NOTE — Telephone Encounter (Signed)
Patient scheduled at [facility] for tomorrow 05/26/21 at 1:20pm. Patient was advised by cma Lynford Humphrey.

## 2021-05-25 NOTE — Telephone Encounter (Signed)
Patient advised of available appt today with Dr. Jacinto Reap. Patient not able to make it at this time. Patient advised of urgent care or [facility]. Patient reports she is willing to be seen at [facility]. Will call [facility] for an appointment.

## 2021-05-25 NOTE — Telephone Encounter (Signed)
Patient scheduled for 05/26/21 at 1:20pm at [facility]. Patient advised

## 2021-05-26 ENCOUNTER — Ambulatory Visit (INDEPENDENT_AMBULATORY_CARE_PROVIDER_SITE_OTHER): Payer: BC Managed Care – PPO | Admitting: Family Medicine

## 2021-05-26 ENCOUNTER — Encounter: Payer: Self-pay | Admitting: Family Medicine

## 2021-05-26 ENCOUNTER — Other Ambulatory Visit: Payer: Self-pay

## 2021-05-26 VITALS — BP 136/82 | HR 93 | Temp 98.6°F | Ht 64.0 in | Wt 186.0 lb

## 2021-05-26 DIAGNOSIS — J00 Acute nasopharyngitis [common cold]: Secondary | ICD-10-CM | POA: Insufficient documentation

## 2021-05-26 DIAGNOSIS — R051 Acute cough: Secondary | ICD-10-CM

## 2021-05-26 LAB — POCT INFLUENZA A/B
Influenza A, POC: NEGATIVE
Influenza B, POC: NEGATIVE

## 2021-05-26 LAB — POC COVID19 BINAXNOW: SARS Coronavirus 2 Ag: NEGATIVE

## 2021-05-26 MED ORDER — PROMETHAZINE-DM 6.25-15 MG/5ML PO SYRP: 2.5000 mL | ORAL_SOLUTION | Freq: Four times a day (QID) | ORAL | 0 refills | Status: DC | PRN

## 2021-05-26 NOTE — Assessment & Plan Note (Signed)
Patient with symptomatology inclusive of congestion, cough that is productive of scant amounts of green sputum, muscle aches ongoing since 05/18/2021.  Denies any fevers or chills, no shortness of air, chest pain.  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  Treatments to date through call to triage nurse have included Mucinex, saline, and Flonase.  She has been utilizing Mucinex on her own for roughly 7 days, Flonase x1-2 days.  She has been noting improvement with these treatments though not resolution.  Physical examination today reveals erythematous right greater than left nasal turbinate with associated swelling, mildly tender right ethmoid and maxillary sinus, otherwise nontender sinuses, tympanic membranes and ear canals bilaterally are benign, oropharynx benign, no lymphadenopathy, lung fields are clear to auscultation throughout without wheezes, rales, rhonchi, cardiac findings benign.  Rapid flu and COVID point of care testing were negative today.  Given her overall clinical course, findings today, concern for viral nasopharyngitis that is improving, I have advised supportive care with continued saline, appropriate use of Flonase, Mucinex, adequate hydration and ways to assess this, adequate rest, and I will write for as needed promethazine-DM as an antitussive as that is her chief ongoing concern.  I have advised patient that if symptoms fail to continue to improve by midweek next week, she is to contact our office for next steps.

## 2021-05-26 NOTE — Patient Instructions (Addendum)
-   Use saline in each nostril every morning to clear out mucus secretions - Once nostrils clean, continue 2 sprays Flonase in each nostril daily - Continue Mucinex daily - Can use new cough medicine every 4 hours on an as-needed basis - Can utilize vitamin C, D, and zinc until symptoms resolve - Drink plenty of free water throughout the day to stay hydrated, use urine color as a guide for hydration status - Get plenty of rest and adequate sleep nightly - Contact your office by midweek next week if symptoms persist or for any change in symptoms
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
## 2021-05-29 ENCOUNTER — Telehealth: Payer: Self-pay

## 2021-05-29 NOTE — Telephone Encounter (Signed)
Copied from [place] [phone]. Topic: General - Other >> May 29, 2021  8:46 AM Valere Dross wrote: Reason for CRM: Pt called in stating she wanted to see about getting a handicap sticker, and wanted to know what she needed to do, she stated she can come into the office and pick up a form today, please advise.

## 2021-05-30 NOTE — Telephone Encounter (Signed)
In providers box for signature.

## 2021-05-30 NOTE — Telephone Encounter (Signed)
Patient reports she gets short of breath with walking a long way.

## 2021-06-01 NOTE — Telephone Encounter (Signed)
Form ready for patient to pick at front desk. Patient advised.

## 2021-06-02 ENCOUNTER — Telehealth: Payer: Self-pay

## 2021-06-02 NOTE — Telephone Encounter (Signed)
lmtcb

## 2021-06-02 NOTE — Telephone Encounter (Signed)
Copied from [place] [phone]. Topic: General - Other >> Jun 02, 2021  9:41 AM Leward Quan A wrote: Reason for CRM: Patient called in to  inform Dr B / Dr Zigmund Daniel that she have cleared [place] a bit from the symptoms that she was having when she came in and was seen but still coughing up some green mucus and was wondering if she can get an antibiotic sent to the pharmacy for her. Asking for a call back at Ph# [phone]

## 2021-06-27 ENCOUNTER — Other Ambulatory Visit: Payer: Self-pay

## 2021-06-27 ENCOUNTER — Encounter: Payer: Self-pay | Admitting: Emergency Medicine

## 2021-06-27 ENCOUNTER — Ambulatory Visit
Admission: EM | Admit: 2021-06-27 | Discharge: 2021-06-27 | Disposition: A | Discharge status: Home / Self Care | Payer: BC Managed Care – PPO

## 2021-06-27 ENCOUNTER — Ambulatory Visit: Payer: Self-pay | Admitting: *Deleted

## 2021-06-27 DIAGNOSIS — S29019A Strain of muscle and tendon of unspecified wall of thorax, initial encounter: Secondary | ICD-10-CM

## 2021-06-27 MED ORDER — BACLOFEN 10 MG PO TABS: 10.0000 mg | ORAL_TABLET | Freq: Three times a day (TID) | ORAL | 0 refills | Status: DC

## 2021-06-27 MED ORDER — METHYLPREDNISOLONE 4 MG PO TBPK: ORAL_TABLET | ORAL | 0 refills | Status: DC

## 2021-06-27 NOTE — ED Triage Notes (Signed)
Pt presents today with c/o of bilateral upper back pain that began today, comes and goes. Denies dysuria or SOB.

## 2021-06-27 NOTE — Telephone Encounter (Signed)
Noted  

## 2021-06-27 NOTE — Telephone Encounter (Signed)
°  Chief Complaint: back burning that is associated with lung pain Symptoms: bilateral lung burning Frequency: today- on/off Pertinent Negatives: Patient denies SOB Disposition: [] ED /[x] Urgent Care (no appt availability in office) / [] Appointment(In office/virtual)/ []  [facility]/ [] Home Care/ [] Refused Recommended Disposition  Additional Notes: Call to office- no open appointment   Reason for Disposition  [1] MILD difficulty breathing (e.g., minimal/no SOB at rest, SOB with walking, pulse <100) AND [2] NEW-onset or WORSE than normal  Answer Assessment - Initial Assessment Questions 1. RESPIRATORY STATUS: "Describe your breathing?" (e.g., wheezing, shortness of breath, unable to speak, severe coughing)      Burning sensation in back- lungs- bilateral 2. ONSET: "When did this breathing problem begin?"      today 3. PATTERN "Does the difficult breathing come and go, or has it been constant since it started?"      Comes and goes 4. SEVERITY: "How bad is your breathing?" (e.g., mild, moderate, severe)    - MILD: No SOB at rest, mild SOB with walking, speaks normally in sentences, can lie down, no retractions, pulse < 100.    - MODERATE: SOB at rest, SOB with minimal exertion and prefers to sit, cannot lie down flat, speaks in phrases, mild retractions, audible wheezing, pulse 100-120.    - SEVERE: Very SOB at rest, speaks in single words, struggling to breathe, sitting hunched forward, retractions, pulse > 120      Severe when when happens- lasting 5 minutes 5. RECURRENT SYMPTOM: "Have you had difficulty breathing before?" If Yes, ask: "When was the last time?" and "What happened that time?"      no 6. CARDIAC HISTORY: "Do you have any history of heart disease?" (e.g., heart attack, angina, bypass surgery, angioplasty)      no 7. LUNG HISTORY: "Do you have any history of lung disease?"  (e.g., pulmonary embolus, asthma, emphysema)     no 8. CAUSE: "What do you think is causing  the breathing problem?"      unsure 9. OTHER SYMPTOMS: "Do you have any other symptoms? (e.g., dizziness, runny nose, cough, chest pain, fever)     no 10. O2 SATURATION MONITOR:  "Do you use an oxygen saturation monitor (pulse oximeter) at home?" If Yes, "What is your reading (oxygen level) today?" "What is your usual oxygen saturation reading?" (e.g., 95%)       *No Answer* 11. PREGNANCY: "Is there any chance you are pregnant?" "When was your last menstrual period?"       *No Answer* 12. TRAVEL: "Have you traveled out of the country in the last month?" (e.g., travel history, exposures)       *No Answer*  Protocols used: Breathing Difficulty-A-AH

## 2021-06-27 NOTE — ED Provider Notes (Signed)
[facility]    CSN: [id number] Arrival date & time: 06/27/21  1132      History   Chief Complaint Chief Complaint  Patient presents with   Back Pain    HPI Nicole Jensen is a 70 y.o. female.   HPI  70 year old female here for evaluation of back pain.  Patient reports that she experienced in her thoracic area of her back this morning that wraps around to both sides.  She states that she is unaware of what might have caused it and does not know of any provoking factors.  She states that sitting down does improve it.  She is concerned that it might be her lungs but she does not have any cough or shortness of breath.  She also has not had a fever.  She denies any chest pain or shortness of breath.  She has had no changes in urination and does not complain or endorse pain with urination.  She did contact her primary care provider who directed her to come to the urgent care.  Past Medical History:  Diagnosis Date   Arthritis    Leg cramps    right leg    Patient Active Problem List   Diagnosis Date Noted   Acute nasopharyngitis 05/26/2021   Prediabetes 11/02/2016   Abnormal blood sugar 08/24/2015   Allergic rhinitis 05/27/2015   Leiomyoma of uterus 05/27/2015   Hypercholesterolemia 05/27/2015   Anterior optic neuritis 05/27/2015   Avitaminosis D 05/27/2015    Past Surgical History:  Procedure Laterality Date   ABDOMINAL HYSTERECTOMY     partial. pt reports she has cervix   BARIATRIC SURGERY  2019   COLONOSCOPY N/A 03/07/2021   Procedure: COLONOSCOPY;  Surgeon: Lin Landsman, MD;  Location: [facility];  Service: Gastroenterology;  Laterality: N/A;   COLONOSCOPY WITH PROPOFOL      OB History     Gravida  2   Para  1   Term      Preterm      AB      Living         SAB      IAB      Ectopic      Multiple      Live Births               Home Medications    Prior to Admission medications   Medication Sig Start Date End  Date Taking? Authorizing Provider  baclofen (LIORESAL) 10 MG tablet Take 1 tablet (10 mg total) by mouth 3 (three) times daily. 06/27/21  Yes Margarette Canada, NP  methylPREDNISolone (MEDROL DOSEPAK) 4 MG TBPK tablet Take according to the package insert. 06/27/21  Yes Margarette Canada, NP  acetaminophen (TYLENOL) 500 MG tablet Take 500 mg by mouth every 6 (six) hours as needed.    [provider]  aspirin 81 MG tablet Take 81 mg by mouth daily.    [provider]  calcium-vitamin D (OSCAL WITH D) 500-200 MG-UNIT tablet Take by mouth daily with breakfast.     [provider]  fluticasone (FLONASE) 50 MCG/ACT nasal spray SPRAY 2 SPRAYS INTO EACH NOSTRIL EVERY DAY 08/17/20   Trinna Post, PA-C  Garlic 10 MG CAPS Take by mouth.    [provider]  Multiple Vitamin (MULTI-VITAMIN) tablet Take 1 tablet by mouth daily.     [provider]  nortriptyline (PAMELOR) 10 MG capsule Take 10 mg by mouth 2 (two) times daily.  05/31/21   [provider]  promethazine-dextromethorphan (PROMETHAZINE-DM) 6.25-15 MG/5ML syrup Take 2.5 mLs by mouth 4 (four) times daily as needed for cough. 05/26/21   Montel Culver, MD  rosuvastatin (CRESTOR) 5 MG tablet Take 1 tablet (5 mg total) by mouth daily. Patient not taking: Reported on 05/26/2021 02/27/21   Virginia Crews, MD    Family History Family History  Problem Relation Age of Onset   Hypertension Mother    Lung cancer Mother    Colon cancer Father    Hypertension Sister    Hypertension Sister    Breast cancer Neg Hx     Social History Social History   Tobacco Use   Smoking status: Never   Smokeless tobacco: Never  Vaping Use   Vaping Use: Never used  Substance Use Topics   Alcohol use: No    Alcohol/week: 0.0 standard drinks   Drug use: No     Allergies   Patient has no known allergies.   Review of Systems Review of Systems  Constitutional:  Negative for activity change, appetite change  and fever.  Respiratory:  Negative for cough and shortness of breath.   Musculoskeletal:  Positive for back pain.  Skin:  Negative for rash.  Neurological:  Negative for weakness and numbness.  Hematological: Negative.   Psychiatric/Behavioral: Negative.      Physical Exam Triage Vital Signs ED Triage Vitals  Enc Vitals Group     BP 06/27/21 1154 115/63     Pulse Rate 06/27/21 1154 70     Resp 06/27/21 1154 18     Temp 06/27/21 1154 98 F (36.7 C)     Temp Source 06/27/21 1154 Oral     SpO2 06/27/21 1154 100 %     Weight --      Height --      Head Circumference --      Peak Flow --      Pain Score 06/27/21 1152 0     Pain Loc --      Pain Edu? --      Excl. in [place]? --    No data found.  Updated Vital Signs BP 115/63 (BP Location: Right Arm)    Pulse 70    Temp 98 F (36.7 C) (Oral)    Resp 18    LMP  (LMP Unknown) Comment: partial hysterectomy   SpO2 100%   Visual Acuity Right Eye Distance:   Left Eye Distance:   Bilateral Distance:    Right Eye Near:   Left Eye Near:    Bilateral Near:     Physical Exam Vitals and nursing note reviewed.  Constitutional:      General: She is not in acute distress.    Appearance: Normal appearance. She is normal weight. She is not ill-appearing.  HENT:     Head: Normocephalic and atraumatic.  Cardiovascular:     Rate and Rhythm: Normal rate and regular rhythm.     Pulses: Normal pulses.     Heart sounds: Normal heart sounds. No murmur heard.   No gallop.  Pulmonary:     Effort: Pulmonary effort is normal.     Breath sounds: Normal breath sounds. No wheezing, rhonchi or rales.  Musculoskeletal:        General: Tenderness present. No swelling or deformity. Normal range of motion.  Skin:    General: Skin is warm and dry.     Capillary Refill: Capillary refill takes less than 2 seconds.  Findings: No erythema or rash.  Neurological:     General: No focal deficit present.     Mental Status: She is alert and oriented to  person, place, and time.  Psychiatric:        Mood and Affect: Mood normal.        Behavior: Behavior normal.        Thought Content: Thought content normal.        Judgment: Judgment normal.     UC Treatments / Results  Labs (all labs ordered are listed, but only abnormal results are displayed) Labs Reviewed - No data to display  EKG   Radiology No results found.  Procedures Procedures (including critical care time)  Medications Ordered in UC Medications - No data to display  Initial Impression / Assessment and Plan / UC Course  I have reviewed the triage vital signs and the nursing notes.  Pertinent labs & imaging results that were available during my care of the patient were reviewed by me and considered in my medical decision making (see chart for details).  Patient is a nontoxic-appearing 70 year old female here for evaluation of bilateral mid back pain that started today.  The pain is not the setting of any known injury or provoking situation.  It is relieved by sitting down.  Patient reports that the pain does wrap around both sides of her chest wall but she did not have any chest pain, shortness of breath, or cough.  On exam patient's lung sounds are clear to auscultation all fields.  She does have bilateral thoracic paraspinous muscle tenderness without any overt spasm.  I suspect that patient's burning sensation is secondary to the muscle tension irritating the nerves of her chest wall.  She does have a history of optic neuritis and is on nortriptyline for that.  I would do a trial of a low-dose Medrol Dosepak to help calm down nerve inflammation as well as prescribed baclofen to help with muscle tension.  She can take the baclofen 3 times daily.  I have also given the patient home physical therapy exercises to perform to help loosen up her back muscles and have directed her to apply moist heat to her back for 20 minutes at a time 2-3 times a day to improve blood flow and see  if it relieves her symptoms.  Her symptoms did not improve she is to follow-up with her primary care provider.   Final Clinical Impressions(s) / UC Diagnoses   Final diagnoses:  Thoracic myofascial strain, initial encounter     Discharge Instructions      Take the Medrol dose pack as directed by the package. Take it with food.   Take the baclofen, 10 mg every 8 hours, on a schedule for the next 48 hours and then as needed.  Apply moist heat to your back for 30 minutes at a time 2-3 times a day to improve blood flow to the area and help remove the lactic acid causing the spasm.  Follow the back exercises given at discharge.  Return for reevaluation for any new or worsening symptoms.      ED Prescriptions     Medication Sig Dispense Auth. Provider   methylPREDNISolone (MEDROL DOSEPAK) 4 MG TBPK tablet Take according to the package insert. 1 each Margarette Canada, NP   baclofen (LIORESAL) 10 MG tablet Take 1 tablet (10 mg total) by mouth 3 (three) times daily. 47 each Margarette Canada, NP      PDMP not reviewed  this encounter.   Margarette Canada, NP 06/27/21 1423

## 2021-06-27 NOTE — Discharge Instructions (Signed)
Take the Medrol dose pack as directed by the package. Take it with food.   Take the baclofen, 10 mg every 8 hours, on a schedule for the next 48 hours and then as needed.  Apply moist heat to your back for 30 minutes at a time 2-3 times a day to improve blood flow to the area and help remove the lactic acid causing the spasm.  Follow the back exercises given at discharge.  Return for reevaluation for any new or worsening symptoms.

## 2021-08-22 ENCOUNTER — Other Ambulatory Visit: Payer: Self-pay | Admitting: Family Medicine

## 2021-08-22 DIAGNOSIS — Z1231 Encounter for screening mammogram for malignant neoplasm of breast: Secondary | ICD-10-CM

## 2021-09-26 ENCOUNTER — Other Ambulatory Visit: Payer: Self-pay

## 2021-09-26 ENCOUNTER — Ambulatory Visit
Admission: RE | Admit: 2021-09-26 | Discharge: 2021-09-26 | Disposition: A | Discharge status: Home / Self Care | Payer: BC Managed Care – PPO | Source: Ambulatory Visit | Attending: Family Medicine | Admitting: Family Medicine

## 2021-09-26 DIAGNOSIS — Z1231 Encounter for screening mammogram for malignant neoplasm of breast: Secondary | ICD-10-CM | POA: Insufficient documentation

## 2022-01-10 ENCOUNTER — Ambulatory Visit (INDEPENDENT_AMBULATORY_CARE_PROVIDER_SITE_OTHER): Payer: Medicare Other | Admitting: Family Medicine

## 2022-01-10 ENCOUNTER — Encounter: Payer: Self-pay | Admitting: Family Medicine

## 2022-01-10 VITALS — BP 142/78 | HR 61 | Temp 98.4°F | Resp 16 | Ht 64.0 in | Wt 196.4 lb

## 2022-01-10 DIAGNOSIS — Z0289 Encounter for other administrative examinations: Secondary | ICD-10-CM

## 2022-01-10 NOTE — Progress Notes (Signed)
    SUBJECTIVE:   CHIEF COMPLAINT / HPI:   Form completion - has been summons to federal jury duty starting 7/10 in [place]. - finds it difficult to drive due to ongoing vision issues after eye trauma in November of last year, unsure of formal diagnosis but having multiple floaters and blurry vision, L>R.  - Follows with Dr Gloriann Loan, ophthalmologist in [place] and undergoing multiple eye surgeries. Most recent surgery 2 weeks ago with next scheduled 7/10.   OBJECTIVE:   BP (!) 142/78 (BP Location: Left Arm, Patient Position: Sitting, Cuff Size: Normal)   Pulse 61   Temp 98.4 F (36.9 C) (Oral)   Resp 16   Ht '5\' 4"'$  (1.626 m)   Wt 196 lb 6.4 oz (89.1 kg)   LMP  (LMP Unknown) Comment: partial hysterectomy  SpO2 99%   BMI 33.71 kg/m   Gen: well appearing, in NAD Card: Reg rate Lungs: Comfortable WOB on RA Ext: WWP   ASSESSMENT/PLAN:   Form completion Reasonable to recommend temporary deferment given vision issues, multiple ongoing surgeries and treatments make driving long distances difficult. Form completed and provided to patient.     Myles Gip, DO

## 2022-02-19 NOTE — Progress Notes (Unsigned)
I,Sha'taria Tyson,acting as a Education administrator for [employer], PA-C.,have documented all relevant documentation on the behalf of Mikey Kirschner, PA-C,as directed by  Mikey Kirschner, PA-C while in the presence of Mikey Kirschner, PA-C.   Annual Wellness Visit     Patient: Nicole Jensen, Female    DOB: 21-Jun-1951, 71 y.o.   MRN: [id number] Visit Date: 02/20/2022  Today's Provider: Mikey Kirschner, PA-C   No chief complaint on file.  Subjective    Nicole Jensen is a 71 y.o. female who presents today for her Annual Wellness Visit. She reports consuming a {diet types:17450} diet. {Exercise:19826} She generally feels {well/fairly well/poorly:18703}. She reports sleeping {well/fairly well/poorly:18703}. She {does/does not:200015} have additional problems to discuss today.   HPI    Medications: Outpatient Medications Prior to Visit  Medication Sig   acetaminophen (TYLENOL) 500 MG tablet Take 500 mg by mouth every 6 (six) hours as needed. (Patient not taking: Reported on 01/10/2022)   aspirin 81 MG tablet Take 81 mg by mouth daily. (Patient not taking: Reported on 01/10/2022)   calcium-vitamin D (OSCAL WITH D) 500-200 MG-UNIT tablet Take by mouth daily with breakfast.  (Patient not taking: Reported on 01/10/2022)   fluticasone (FLONASE) 50 MCG/ACT nasal spray SPRAY 2 SPRAYS INTO EACH NOSTRIL EVERY DAY (Patient not taking: Reported on 0/81/4481)   Garlic 10 MG CAPS Take by mouth. (Patient not taking: Reported on 01/10/2022)   Multiple Vitamin (MULTI-VITAMIN) tablet Take 1 tablet by mouth daily.  (Patient not taking: Reported on 01/10/2022)   nortriptyline (PAMELOR) 10 MG capsule Take 10 mg by mouth 2 (two) times daily. (Patient not taking: Reported on 01/10/2022)   rosuvastatin (CRESTOR) 5 MG tablet Take 1 tablet (5 mg total) by mouth daily. (Patient not taking: Reported on 01/10/2022)   No facility-administered medications prior to visit.    No Known Allergies  Patient Care Team: Bacigalupo,  Dionne Bucy, MD as PCP - General (Family Medicine) Lorelee Cover., MD (Ophthalmology) Bonner Puna, MD as Referring Physician (Specialist) Wonda Olds (Physician Assistant)  Review of Systems  {Labs  Heme  Chem  Endocrine  Serology  Results Review (optional):23779}    Objective    Vitals: LMP  (LMP Unknown) Comment: partial hysterectomy {Show previous vital signs (optional):23777}   Physical Exam ***  Most recent functional status assessment:    01/10/2022    9:27 AM  In your present state of health, do you have any difficulty performing the following activities:  Hearing? 0  Vision? 0  Difficulty concentrating or making decisions? 0  Walking or climbing stairs? 1  Dressing or bathing? 0  Doing errands, shopping? 0   Most recent fall risk assessment:    01/10/2022    9:27 AM  Fall Risk   Falls in the past year? 0  Number falls in past yr: 0  Injury with Fall? 0    Most recent depression screenings:    01/10/2022    9:27 AM 05/26/2021    1:43 PM  PHQ 2/9 Scores  PHQ - 2 Score 0 2  PHQ- 9 Score  8   Most recent cognitive screening:     No data to display         Most recent Audit-C alcohol use screening    01/10/2022    9:27 AM  Alcohol Use Disorder Test (AUDIT)  1. How often do you have a drink containing alcohol? 0  2. How many drinks containing alcohol do you have on  a typical day when you are drinking? 0  3. How often do you have six or more drinks on one occasion? 0  AUDIT-C Score 0   A score of 3 or more in women, and 4 or more in men indicates increased risk for alcohol abuse, EXCEPT if all of the points are from question 1   No results found for any visits on 02/20/22.  Assessment & Plan     Annual wellness visit done today including the all of the following: Reviewed patient's Family Medical History Reviewed and updated list of patient's medical providers Assessment of cognitive impairment was done Assessed patient's  functional ability Established a written schedule for health screening [place] Completed and Reviewed  Exercise Activities and Dietary recommendations  Goals      DIET - INCREASE WATER INTAKE     Recommend to drink at least 6-8 8oz glasses of water per day.        Immunization History  Administered Date(s) Administered   Influenza Split 09/01/2010   PFIZER(Purple Top)SARS-COV-2 Vaccination 09/11/2019, 10/02/2019   PPD Test 02/23/2019   Tdap 12/02/2008, 12/18/2019    Health Maintenance  Topic Date Due   Hepatitis C Screening  Never done   Zoster Vaccines- Shingrix (1 of 2) Never done   Pneumonia Vaccine 35+ Years old (1 - PCV) Never done   COVID-19 Vaccine (3 - Pfizer series) 11/27/2019   INFLUENZA VACCINE  02/13/2022   MAMMOGRAM  09/27/2023   COLONOSCOPY (Pts 45-36yr Insurance coverage will need to be confirmed)  03/07/2024   TETANUS/TDAP  12/17/2029   DEXA SCAN  Completed   HPV VACCINES  Aged Out     Discussed health benefits of physical activity, and encouraged her to engage in regular exercise appropriate for her age and condition.    ***  No follow-ups on file.     {provider attestation***:1}   LMikey Kirschner PA-C  [phone](phone) [phone](fax)  [place]

## 2022-02-20 ENCOUNTER — Encounter: Payer: Self-pay | Admitting: Physician Assistant

## 2022-02-20 ENCOUNTER — Ambulatory Visit (INDEPENDENT_AMBULATORY_CARE_PROVIDER_SITE_OTHER): Payer: Medicare Other | Admitting: Physician Assistant

## 2022-02-20 VITALS — BP 139/72 | HR 69 | Ht 64.0 in | Wt 194.3 lb

## 2022-02-20 DIAGNOSIS — E669 Obesity, unspecified: Secondary | ICD-10-CM

## 2022-02-20 DIAGNOSIS — R7303 Prediabetes: Secondary | ICD-10-CM | POA: Diagnosis not present

## 2022-02-20 DIAGNOSIS — Z780 Asymptomatic menopausal state: Secondary | ICD-10-CM

## 2022-02-20 DIAGNOSIS — E7800 Pure hypercholesterolemia, unspecified: Secondary | ICD-10-CM | POA: Diagnosis not present

## 2022-02-20 DIAGNOSIS — Z0000 Encounter for general adult medical examination without abnormal findings: Secondary | ICD-10-CM | POA: Diagnosis not present

## 2022-02-20 DIAGNOSIS — Z13820 Encounter for screening for osteoporosis: Secondary | ICD-10-CM

## 2022-02-20 DIAGNOSIS — M25551 Pain in right hip: Secondary | ICD-10-CM | POA: Insufficient documentation

## 2022-02-20 DIAGNOSIS — J309 Allergic rhinitis, unspecified: Secondary | ICD-10-CM

## 2022-02-20 DIAGNOSIS — M79604 Pain in right leg: Secondary | ICD-10-CM | POA: Diagnosis not present

## 2022-02-20 DIAGNOSIS — Z862 Personal history of diseases of the blood and blood-forming organs and certain disorders involving the immune mechanism: Secondary | ICD-10-CM | POA: Insufficient documentation

## 2022-02-20 DIAGNOSIS — D649 Anemia, unspecified: Secondary | ICD-10-CM | POA: Insufficient documentation

## 2022-02-20 MED ORDER — FLUTICASONE PROPIONATE 50 MCG/ACT NA SUSP: 1.0000 | Freq: Every day | NASAL | 3 refills | Status: DC

## 2022-02-20 NOTE — Assessment & Plan Note (Signed)
Historically, pt did not want to take statin  Will recheck fasting lipids Advised pt to consider statin again

## 2022-02-20 NOTE — Assessment & Plan Note (Signed)
Refilled flonase  

## 2022-02-20 NOTE — Assessment & Plan Note (Signed)
Likely etiology of hip OA  Advised pt I would refer to ortho. She declines for now

## 2022-02-20 NOTE — Assessment & Plan Note (Signed)
Historically will recheck

## 2022-02-20 NOTE — Assessment & Plan Note (Signed)
Historically will check a1c 

## 2022-02-27 ENCOUNTER — Ambulatory Visit: Payer: Self-pay | Admitting: *Deleted

## 2022-02-27 DIAGNOSIS — E7800 Pure hypercholesterolemia, unspecified: Secondary | ICD-10-CM

## 2022-02-27 NOTE — Telephone Encounter (Signed)
Attempted to return call.   Left voicemail to call back.

## 2022-02-27 NOTE — Telephone Encounter (Signed)
Attempted to return call.   Left message to call back.  It was noted that she was not taking the Crestor because she did not want to take a statin however she was willing to try again.  During OV on 02/20/2022 with Mikey Kirschner, PA-C she was to have fasting labs drawn.   Looking in her chart the labs have not been drawn.   Need clarification on lab work status.

## 2022-02-27 NOTE — Telephone Encounter (Signed)
Message from [place] Callas sent at 02/27/2022 12:49 PM EDT  Summary: prescription request   Pt called saying she was supposed to get Crestor 5 mg  sent to [facility] [place] after her visit on the 8th with the provider.  It looks like it was sent to [facility] which is closed.  Can we have it sent over to [facility] in [place].           Call History   Type Contact Phone/Fax User  02/27/2022 12:47 PM EDT Phone (Incoming) Serine, Kea (Self) [phone] Jerilynn Mages) Greggory Keen D

## 2022-02-27 NOTE — Telephone Encounter (Signed)
3rd attempt at returning her call.   Per protocol I have forwarded this to [facility].

## 2022-02-28 ENCOUNTER — Telehealth: Payer: Self-pay

## 2022-02-28 LAB — CBC WITH DIFFERENTIAL/PLATELET
Basophils Absolute: 0 10*3/uL (ref 0.0–0.2)
Basos: 0 %
EOS (ABSOLUTE): 0.1 10*3/uL (ref 0.0–0.4)
Eos: 2 %
Hematocrit: 38.2 % (ref 34.0–46.6)
Hemoglobin: 12.1 g/dL (ref 11.1–15.9)
Immature Grans (Abs): 0 10*3/uL (ref 0.0–0.1)
Immature Granulocytes: 0 %
Lymphocytes Absolute: 1.5 10*3/uL (ref 0.7–3.1)
Lymphs: 45 %
MCH: 26.5 pg — ABNORMAL LOW (ref 26.6–33.0)
MCHC: 31.7 g/dL (ref 31.5–35.7)
MCV: 84 fL (ref 79–97)
Monocytes Absolute: 0.4 10*3/uL (ref 0.1–0.9)
Monocytes: 11 %
Neutrophils Absolute: 1.4 10*3/uL (ref 1.4–7.0)
Neutrophils: 42 %
Platelets: 207 10*3/uL (ref 150–450)
RBC: 4.56 x10E6/uL (ref 3.77–5.28)
RDW: 14.3 % (ref 11.7–15.4)
WBC: 3.3 10*3/uL — ABNORMAL LOW (ref 3.4–10.8)

## 2022-02-28 LAB — COMPREHENSIVE METABOLIC PANEL
ALT: 20 IU/L (ref 0–32)
AST: 31 IU/L (ref 0–40)
Albumin/Globulin Ratio: 1.2 (ref 1.2–2.2)
Albumin: 3.9 g/dL (ref 3.9–4.9)
Alkaline Phosphatase: 98 IU/L (ref 44–121)
BUN/Creatinine Ratio: 19 (ref 12–28)
BUN: 15 mg/dL (ref 8–27)
Bilirubin Total: 0.3 mg/dL (ref 0.0–1.2)
CO2: 23 mmol/L (ref 20–29)
Calcium: 9.2 mg/dL (ref 8.7–10.3)
Chloride: 105 mmol/L (ref 96–106)
Creatinine, Ser: 0.81 mg/dL (ref 0.57–1.00)
Globulin, Total: 3.3 g/dL (ref 1.5–4.5)
Glucose: 81 mg/dL (ref 70–99)
Sodium: 143 mmol/L (ref 134–144)
Total Protein: 7.2 g/dL (ref 6.0–8.5)
eGFR: 79 mL/min/{1.73_m2} (ref >59)

## 2022-02-28 LAB — LIPID PANEL
Chol/HDL Ratio: 2.8 ratio (ref 0.0–4.4)
Cholesterol, Total: 224 mg/dL — ABNORMAL HIGH (ref 100–199)
HDL: 79 mg/dL (ref >39)
LDL Chol Calc (NIH): 134 mg/dL — ABNORMAL HIGH (ref 0–99)
Triglycerides: 62 mg/dL (ref 0–149)
VLDL Cholesterol Cal: 11 mg/dL (ref 5–40)

## 2022-02-28 LAB — HEMOGLOBIN A1C
Est. average glucose Bld gHb Est-mCnc: 114 mg/dL
Hgb A1c MFr Bld: 5.6 % (ref 4.8–5.6)

## 2022-02-28 MED ORDER — ROSUVASTATIN CALCIUM 5 MG PO TABS
5.0000 mg | ORAL_TABLET | Freq: Every day | ORAL | 1 refills | Status: DC
Start: 2022-02-28 — End: 2022-08-23

## 2022-02-28 NOTE — Telephone Encounter (Signed)
Pt given lab results per notes of L. Drubel PA-C on 02/28/22. Pt verbalized understanding.

## 2022-02-28 NOTE — Addendum Note (Signed)
Addended by: Barnie Mort on: 02/28/2022 08:57 AM   Modules accepted: Orders

## 2022-03-20 ENCOUNTER — Other Ambulatory Visit: Payer: Medicare Other

## 2022-04-23 ENCOUNTER — Other Ambulatory Visit: Payer: Self-pay

## 2022-04-30 ENCOUNTER — Other Ambulatory Visit: Payer: Medicare Other

## 2022-06-14 ENCOUNTER — Ambulatory Visit: Payer: Medicare HMO | Admitting: Podiatry

## 2022-06-19 ENCOUNTER — Ambulatory Visit: Payer: Medicare HMO | Admitting: Podiatry

## 2022-06-19 DIAGNOSIS — Q828 Other specified congenital malformations of skin: Secondary | ICD-10-CM

## 2022-06-19 NOTE — Progress Notes (Signed)
  Subjective:  Patient ID: Nicole Jensen, female    DOB: [birth year],  MRN: [id number]  Chief Complaint  Patient presents with   Callouses    71 y.o. female presents with the above complaint.  Patient presents with right submetatarsal 5 porokeratotic lesion/benign skin lesion.  Patient states pain for touch is progressive gotten worse hurts with ambulation worse with pressure she has not seen anyone else for her to see me for this.  Denies any other acute complaints.  Would like to discuss treatment options for this.   Review of Systems: Negative except as noted in the HPI. Denies N/V/F/Ch.  Past Medical History:  Diagnosis Date   Arthritis    Leg cramps    right leg    Current Outpatient Medications:    aspirin 81 MG tablet, Take 81 mg by mouth daily. As needed, Disp: , Rfl:    fluticasone (FLONASE) 50 MCG/ACT nasal spray, Place 1 spray into both nostrils daily., Disp: 16 mL, Rfl: 3   rosuvastatin (CRESTOR) 5 MG tablet, Take 1 tablet (5 mg total) by mouth daily., Disp: 90 tablet, Rfl: 1  Social History   Tobacco Use  Smoking Status Never  Smokeless Tobacco Never    No Known Allergies Objective:  There were no vitals filed for this visit. There is no height or weight on file to calculate BMI. Constitutional Well developed. Well nourished.  Vascular Dorsalis pedis pulses palpable bilaterally. Posterior tibial pulses palpable bilaterally. Capillary refill normal to all digits.  No cyanosis or clubbing noted. Pedal hair growth normal.  Neurologic Normal speech. Oriented to person, place, and time. Epicritic sensation to light touch grossly present bilaterally.  Dermatologic Right submetatarsal 3 porokeratotic lesion with central nucleated core noted.  Pain on palpation to the lesion.  No pinpoint bleeding noted.  Orthopedic: Normal joint ROM without pain or crepitus bilaterally. No visible deformities. No bony tenderness.   Radiographs: None Assessment:   1.  Porokeratosis    Plan:  Patient was evaluated and treated and all questions answered.  Right submetatarsal 3 porokeratosis -All questions and concerns were discussed with the patient in extensive detail -Using chisel blade to handle the lesion was debrided down to healthy striated tissue followed by excision of central nucleated core.  No pinpoint bleeding noted no complication noted. -I discussed shoe gear modification as well  No follow-ups on file.

## 2022-07-04 ENCOUNTER — Ambulatory Visit
Admission: RE | Admit: 2022-07-04 | Discharge: 2022-07-04 | Disposition: A | Discharge status: Home / Self Care | Payer: Medicare HMO | Source: Ambulatory Visit | Attending: Physician Assistant | Admitting: Physician Assistant

## 2022-07-04 DIAGNOSIS — Z780 Asymptomatic menopausal state: Secondary | ICD-10-CM | POA: Insufficient documentation

## 2022-07-04 DIAGNOSIS — Z13820 Encounter for screening for osteoporosis: Secondary | ICD-10-CM | POA: Insufficient documentation

## 2022-08-14 ENCOUNTER — Telehealth: Payer: Self-pay

## 2022-08-14 NOTE — Telephone Encounter (Signed)
Copied from [place] [phone]. Topic: Appointment Scheduling - Scheduling Inquiry for Clinic >> Aug 14, 2022  1:56 PM Sabas Sous wrote: Reason for CRM: Pt wants to schedule a MWV, she wants to come in to the office.

## 2022-08-14 NOTE — Telephone Encounter (Signed)
Pt calling to FU on bone density results. Pt given lab results per notes of Ria Comment, [place] on 08/14/22. Pt verbalized understanding. Pt was asking about AWV for MDC needing exam done. Called and spoke with Arbie Cookey, [facility] to see if pt needed separate appt. She advised if pt due should be able to do everything in OV on 08/23/22. Also advised pt she had AWV on 02/20/22 so shouldn't be due again until 8/202. Pt verbalized understanding and will come in on 08/23/22 for OV.

## 2022-08-14 NOTE — Telephone Encounter (Signed)
Agree that not due for AWV until 02/2023 if last was in 02/2022

## 2022-08-16 DIAGNOSIS — K080 Exfoliation of teeth due to systemic causes: Secondary | ICD-10-CM | POA: Diagnosis not present

## 2022-08-22 NOTE — Progress Notes (Unsigned)
I,Payden Bonus S Blayke Pinera,acting as a Education administrator for Lavon Paganini, MD.,have documented all relevant documentation on the behalf of Lavon Paganini, MD,as directed by  Lavon Paganini, MD while in the presence of Lavon Paganini, MD.     Established patient visit   Patient: Nicole Jensen   DOB: Apr 14, 1951   72 y.o. Female  MRN: [id number] Visit Date: 08/23/2022  Today's healthcare provider: Lavon Paganini, MD   Chief Complaint  Patient presents with   Hyperlipidemia   Hyperglycemia   Subjective    HPI  Prediabetes, Follow-up  Lab Results  Component Value Date   HGBA1C 5.6 02/27/2022   HGBA1C 5.6 02/20/2021   HGBA1C 5.8 (H) 08/12/2019   GLUCOSE 81 02/27/2022   GLUCOSE 79 02/20/2021   GLUCOSE 83 08/12/2019    Last seen for for this6 months ago.  Management since that visit includes no changes. Current symptoms include none and have been stable.  Pertinent Labs:    Component Value Date/Time   CHOL 224 (H) 02/27/2022 1100   TRIG 62 02/27/2022 1100   CHOLHDL 2.8 02/27/2022 1100   CREATININE 0.81 02/27/2022 1100    Wt Readings from Last 3 Encounters:  08/23/22 180 lb (81.6 kg)  02/20/22 194 lb 4.8 oz (88.1 kg)  01/10/22 196 lb 6.4 oz (89.1 kg)  ---------------------------------------------------------------------------------------------------------------------------------------- Lipid/Cholesterol, Follow-up  Last lipid panel Other pertinent labs  Lab Results  Component Value Date   CHOL 224 (H) 02/27/2022   HDL 79 02/27/2022   LDLCALC 134 (H) 02/27/2022   TRIG 62 02/27/2022   CHOLHDL 2.8 02/27/2022   Lab Results  Component Value Date   ALT 20 02/27/2022   AST 31 02/27/2022   PLT 207 02/27/2022   TSH 1.760 08/12/2019     She was last seen for this 6 months ago.  Management since that visit includes start Crestor 5 mg daily.  She reports excellent compliance with treatment. She is not having side effects.  The 10-year ASCVD risk score (Arnett  DK, et al., 2019) is: 14.7%  --------------------------------------------------------------------------------------------------- R hip/thigh pain for many months.  Has been taking tylenol arthritis without relief. No known injury.  Worse when getting up after long periods of sitting.  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  Medications: Outpatient Medications Prior to Visit  Medication Sig   aspirin 81 MG tablet Take 81 mg by mouth daily. As needed   [DISCONTINUED] fluticasone (FLONASE) 50 MCG/ACT nasal spray Place 1 spray into both nostrils daily.   [DISCONTINUED] rosuvastatin (CRESTOR) 5 MG tablet Take 1 tablet (5 mg total) by mouth daily.   No facility-administered medications prior to visit.    Review of Systems  Constitutional:  Negative for appetite change and fatigue.  Eyes:  Negative for visual disturbance.  Respiratory:  Negative for chest tightness and shortness of breath.   Cardiovascular:  Negative for chest pain and leg swelling.  Gastrointestinal:  Negative for abdominal pain and nausea.  Musculoskeletal:  Positive for arthralgias and myalgias.       Objective    BP 135/79 (BP Location: Right Arm, Patient Position: Bed low/side rails up, Cuff Size: Small)   Pulse 69   Temp 97.7 F (36.5 C) (Temporal)   Resp 16   Ht '5\' 3"'$  (1.6 m)   Wt 180 lb (81.6 kg)   LMP  (LMP Unknown) Comment: partial hysterectomy  SpO2 99%   BMI 31.89 kg/m    Physical Exam Vitals  reviewed.  Constitutional:      General: She is not in acute distress.    Appearance: Normal appearance. She is well-developed. She is not diaphoretic.  HENT:     Head: Normocephalic and atraumatic.  Eyes:     General: No scleral icterus.    Conjunctiva/sclera: Conjunctivae normal.  Neck:     Thyroid: No thyromegaly.  Cardiovascular:     Rate and Rhythm: Normal rate and regular rhythm.     Pulses: Normal pulses.     Heart sounds: Normal heart sounds. No  murmur heard. Pulmonary:     Effort: Pulmonary effort is normal. No respiratory distress.     Breath sounds: Normal breath sounds. No wheezing, rhonchi or rales.  Musculoskeletal:     Cervical back: Neck supple.     Right lower leg: No edema.     Left lower leg: No edema.     Comments: R hip: Pain with flexion and internal rotation and adduction. No TTP over bony landmarks  Lymphadenopathy:     Cervical: No cervical adenopathy.  Skin:    General: Skin is warm and dry.     Findings: No rash.  Neurological:     Mental Status: She is alert and oriented to person, place, and time. Mental status is at baseline.  Psychiatric:        Mood and Affect: Mood normal.        Behavior: Behavior normal.       No results found for any visits on 08/23/22.  Assessment & Plan     Problem List Items Addressed This Visit       Respiratory   Allergic rhinitis   Relevant Medications   fluticasone (FLONASE) 50 MCG/ACT nasal spray     Other   Hypercholesterolemia - Primary    Previously well controlled Continue statin Repeat FLP and CMP      Relevant Medications   rosuvastatin (CRESTOR) 5 MG tablet   Other Relevant Orders   Lipid Panel With LDL/HDL Ratio   Comprehensive metabolic panel   Prediabetes    Recommend low carb diet Recheck A1c       Relevant Orders   Hemoglobin A1c   Right hip pain    Most likely hip OA Will get XRay Continue tylenol arthritis Add meloxicam PT referral Can consider Ortho referral if not improving      Relevant Orders   DG HIP UNILAT WITH PELVIS 2-3 VIEWS RIGHT   Ambulatory referral to Physical Therapy   History of anemia    Recheck CBC      Relevant Orders   CBC     Return in about 6 months (around 02/21/2023) for AWV, CPE.      I, Lavon Paganini, MD, have reviewed all documentation for this visit. The documentation on 08/23/22 for the exam, diagnosis, procedures, and orders are all accurate and complete.   Bacigalupo, Dionne Bucy, MD,  [facility]

## 2022-08-23 ENCOUNTER — Encounter: Payer: Self-pay | Admitting: Family Medicine

## 2022-08-23 ENCOUNTER — Ambulatory Visit (INDEPENDENT_AMBULATORY_CARE_PROVIDER_SITE_OTHER): Payer: Medicare Other | Admitting: Family Medicine

## 2022-08-23 VITALS — BP 135/79 | HR 69 | Temp 97.7°F | Resp 16 | Ht 63.0 in | Wt 180.0 lb

## 2022-08-23 DIAGNOSIS — E7800 Pure hypercholesterolemia, unspecified: Secondary | ICD-10-CM

## 2022-08-23 DIAGNOSIS — R7303 Prediabetes: Secondary | ICD-10-CM

## 2022-08-23 DIAGNOSIS — Z862 Personal history of diseases of the blood and blood-forming organs and certain disorders involving the immune mechanism: Secondary | ICD-10-CM | POA: Diagnosis not present

## 2022-08-23 DIAGNOSIS — J309 Allergic rhinitis, unspecified: Secondary | ICD-10-CM | POA: Diagnosis not present

## 2022-08-23 DIAGNOSIS — M25551 Pain in right hip: Secondary | ICD-10-CM

## 2022-08-23 MED ORDER — MELOXICAM 15 MG PO TABS: 15.0000 mg | ORAL_TABLET | Freq: Every day | ORAL | 0 refills | Status: DC

## 2022-08-23 MED ORDER — ROSUVASTATIN CALCIUM 5 MG PO TABS: 5.0000 mg | ORAL_TABLET | Freq: Every day | ORAL | 1 refills | Status: DC

## 2022-08-23 MED ORDER — FLUTICASONE PROPIONATE 50 MCG/ACT NA SUSP: 1.0000 | Freq: Every day | NASAL | 3 refills | Status: DC

## 2022-08-23 NOTE — Assessment & Plan Note (Signed)
Recommend low carb diet °Recheck A1c  °

## 2022-08-23 NOTE — Assessment & Plan Note (Signed)
Most likely hip OA Will get XRay Continue tylenol arthritis Add meloxicam PT referral Can consider Ortho referral if not improving

## 2022-08-23 NOTE — Assessment & Plan Note (Signed)
Previously well controlled Continue statin Repeat FLP and CMP  

## 2022-08-23 NOTE — Assessment & Plan Note (Signed)
Recheck CBC. 

## 2022-08-24 LAB — CBC
Hematocrit: 34.2 % (ref 34.0–46.6)
Hemoglobin: 11.3 g/dL (ref 11.1–15.9)
MCH: 27.4 pg (ref 26.6–33.0)
MCHC: 33 g/dL (ref 31.5–35.7)
MCV: 83 fL (ref 79–97)
Platelets: 179 10*3/uL (ref 150–450)
RBC: 4.12 x10E6/uL (ref 3.77–5.28)
RDW: 13.7 % (ref 11.7–15.4)
WBC: 3.5 10*3/uL (ref 3.4–10.8)

## 2022-08-24 LAB — COMPREHENSIVE METABOLIC PANEL
ALT: 23 IU/L (ref 0–32)
AST: 28 IU/L (ref 0–40)
Albumin/Globulin Ratio: 1.4 (ref 1.2–2.2)
Albumin: 3.9 g/dL (ref 3.9–4.9)
Alkaline Phosphatase: 95 IU/L (ref 44–121)
BUN/Creatinine Ratio: 14 (ref 12–28)
BUN: 11 mg/dL (ref 8–27)
Bilirubin Total: 0.3 mg/dL (ref 0.0–1.2)
CO2: 23 mmol/L (ref 20–29)
Calcium: 9.2 mg/dL (ref 8.7–10.3)
Chloride: 106 mmol/L (ref 96–106)
Creatinine, Ser: 0.77 mg/dL (ref 0.57–1.00)
Globulin, Total: 2.8 g/dL (ref 1.5–4.5)
Glucose: 69 mg/dL — ABNORMAL LOW (ref 70–99)
Potassium: 3.9 mmol/L (ref 3.5–5.2)
Sodium: 142 mmol/L (ref 134–144)
Total Protein: 6.7 g/dL (ref 6.0–8.5)
eGFR: 83 mL/min/{1.73_m2} (ref >59)

## 2022-08-24 LAB — LIPID PANEL WITH LDL/HDL RATIO
Cholesterol, Total: 203 mg/dL — ABNORMAL HIGH (ref 100–199)
HDL: 70 mg/dL (ref >39)
LDL Chol Calc (NIH): 125 mg/dL — ABNORMAL HIGH (ref 0–99)
LDL/HDL Ratio: 1.8 ratio (ref 0.0–3.2)
Triglycerides: 45 mg/dL (ref 0–149)
VLDL Cholesterol Cal: 8 mg/dL (ref 5–40)

## 2022-08-24 LAB — HEMOGLOBIN A1C
Est. average glucose Bld gHb Est-mCnc: 120 mg/dL
Hgb A1c MFr Bld: 5.8 % — ABNORMAL HIGH (ref 4.8–5.6)

## 2022-08-30 ENCOUNTER — Ambulatory Visit
Admission: RE | Admit: 2022-08-30 | Discharge: 2022-08-30 | Disposition: A | Discharge status: Home / Self Care | Payer: Medicare Other | Source: Ambulatory Visit | Attending: Family Medicine | Admitting: Family Medicine

## 2022-08-30 DIAGNOSIS — M25551 Pain in right hip: Secondary | ICD-10-CM

## 2022-10-18 ENCOUNTER — Telehealth: Payer: Self-pay | Admitting: Physical Therapy

## 2022-10-18 NOTE — Telephone Encounter (Signed)
Called pt to inquire about moving up her eval to earlier day because of increased availability. Pt did not respond so left VM instructing pt to call back.

## 2022-10-23 ENCOUNTER — Encounter: Payer: Self-pay | Admitting: Physical Therapy

## 2022-10-23 ENCOUNTER — Ambulatory Visit: Payer: Medicare Other | Attending: Family Medicine | Admitting: Physical Therapy

## 2022-10-23 DIAGNOSIS — M25551 Pain in right hip: Secondary | ICD-10-CM | POA: Diagnosis not present

## 2022-10-23 DIAGNOSIS — R262 Difficulty in walking, not elsewhere classified: Secondary | ICD-10-CM | POA: Diagnosis not present

## 2022-10-23 NOTE — Therapy (Signed)
OUTPATIENT PHYSICAL THERAPY LOWER EXTREMITY EVALUATION   Patient Name: Nicole Jensen MRN: [id number] DOB:[birth year], 72 y.o., female Today's Date: 10/23/2022  END OF SESSION:  PT End of Session - 10/23/22 1125     Visit Number 1    Number of Visits 20    Date for PT Re-Evaluation 01/01/23    Authorization Type Blue [employer]    Authorization Time Period Based on [place]    Authorization - Visit Number 1    Authorization - Number of Visits 20    Progress Note Due on Visit 10    PT Start Time 0800    PT Stop Time 0845    PT Time Calculation (min) 45 min    Activity Tolerance Patient limited by pain    Behavior [facility] for tasks assessed/performed             Past Medical History:  Diagnosis Date   Arthritis    Leg cramps    right leg   Past Surgical History:  Procedure Laterality Date   ABDOMINAL HYSTERECTOMY     partial. pt reports she has cervix   BARIATRIC SURGERY  2019   COLONOSCOPY N/A 03/07/2021   Procedure: COLONOSCOPY;  Surgeon: Toney ReilVanga, Rohini Reddy, MD;  [facility];  Service: Gastroenterology;  Laterality: N/A;   COLONOSCOPY WITH PROPOFOL     Patient Active Problem List   Diagnosis Date Noted   Right hip pain 02/20/2022   History of anemia 02/20/2022   Prediabetes 11/02/2016   Allergic rhinitis 05/27/2015   Leiomyoma of uterus 05/27/2015   Hypercholesterolemia 05/27/2015   Anterior optic neuritis 05/27/2015   Avitaminosis D 05/27/2015    PCP: Dr. Shirlee LatchAngela Bacigalupo   REFERRING PROVIDER: Dr. Shirlee LatchAngela Bacigalupo   REFERRING DIAG: Right hip pain, right hip OA   THERAPY DIAG:  Pain in right hip  Difficulty in walking, not elsewhere classified  Rationale for Evaluation and Treatment: Rehabilitation  ONSET DATE: 10/22/2016   SUBJECTIVE:   SUBJECTIVE STATEMENT: See pertinent information   PERTINENT HISTORY: Pt reports that she had a recent fall after tripping over steps. However, she was referred for her right hip for severe  OA. 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 She has been treating pain with cream and stopped taking tylenol extra strength. She had bariatric surgery in 2019 and she has been able to keep weight off since having surgery. 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  PAIN:  Are you having pain? Yes: NPRS scale: 8/10 Pain location: Right hip  Pain description: Stiff and achy  Aggravating factors: moving after sitting for a long period of time.  Relieving factors: Not moving or sitting and Thermowork Cream    PRECAUTIONS: None  WEIGHT BEARING RESTRICTIONS: No  FALLS:  Has patient fallen in last 6 months? Yes. 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   NEXT MD VISIT: Not sure   OBJECTIVE:   VITALS: BP 122/74 SpO2 100 HR 71   DIAGNOSTIC FINDINGS: CLINICAL DATA:  Right hip pain  EXAM: DG HIP (WITH OR WITHOUT PELVIS) 2-3V RIGHT   COMPARISON:  None Available.   FINDINGS: There is moderate-severe axial joint space narrowing of the right hip with osteophyte formation, subchondral sclerosis and cystic change. Moderate degenerative changes of the left hip. Moderate-severe degenerative changes of the symphysis pubis with subchondral sclerosis.   IMPRESSION: Moderate-severe osteoarthritis of the right hip.     Electronically Signed   By: Caprice Renshaw M.D.   On: 09/02/2022 10:23  PATIENT SURVEYS:  FOTO 66/100 with target 72   COGNITION: Overall cognitive status: Within functional limits for tasks  assessed     SENSATION: WFL    MUSCLE LENGTH: Hamstrings: Right 90 deg; Left 70 deg Thomas test: Negative bilateral   POSTURE: rounded shoulders and forward head  PALPATION: Right and left greater trochanter TTP but inside hip (c- sign)  LOWER EXTREMITY ROM:  Active ROM Right eval Left eval  Hip flexion    Hip extension    Hip abduction    Hip adduction    Hip internal rotation    Hip external rotation    Knee flexion 120* 120*  Knee extension    Ankle dorsiflexion    Ankle plantarflexion    Ankle inversion    Ankle eversion     (Blank rows = not tested)         LOWER EXTREMITY MMT:  MMT Right eval Left eval  Hip flexion 4 4  Hip extension 3 3-  Hip abduction 4- 4-  Hip adduction 3- 3+  Hip internal rotation    Hip external rotation    Knee flexion 4 4  Knee extension 4 4  Ankle dorsiflexion 4 4  Ankle plantarflexion    Ankle inversion    Ankle eversion     (Blank rows = not tested)    LOWER EXTREMITY SPECIAL TESTS:  Hip special tests: Luisa Hart (FABER) test: positive , Ely's test: positive , and Anterior hip impingement test: positive   FUNCTIONAL TESTS:  5 times sit to stand: NT 30 seconds chair stand test 6 minute walk test: NT 10 meter walk test: NT Dynamic Gait Index: NT   GAIT: Distance walked: 50 ft  Assistive device utilized: None Level of assistance: Complete Independence Comments: Forward flexed posture and decreased stance time and step length on right foot    TODAY'S TREATMENT:                                                                                                                              DATE:   10/23/22:  Supine Bridges 1 x 10  Seated Hip ER stretch 2 x 60 sec  Seated Hamstring Stretch 2 x 60 sec   PATIENT EDUCATION:  Education details: form and technique for correct performance of exercise  Person educated: Patient Education method: Programmer, multimedia, Demonstration, Verbal cues, and Handouts Education  comprehension: verbalized understanding, returned demonstration, and verbal cues required  HOME EXERCISE PROGRAM: Access Code:  [email or web address] URL: [email or web address] Date: 10/23/2022 Prepared by: Ellin Goodie  Exercises - Seated Hamstring Stretch  - 1 x daily - 3 reps - 60 sec  hold - Supine Bridge  - 3 x weekly - 3 sets - 10 reps  ASSESSMENT:  CLINICAL IMPRESSION: Patient is a 72 y.o. AA female who was seen today for physical therapy evaluation and treatment for right hip pain in the setting of severe right hip OA. She shows signs and symptoms of right hip OA with increased pain with flexion especially with walking up stairs and increased pain with weight bearing over a prolonged period of time or when moving after sitting for a prolonged period of time. She also exhibits deficits that include decreased hip strength and mobility along with increased pain with weight bearing that has resulted in difficulty with walking and bending and stooping to perform job related tasks as a Copy.   OBJECTIVE IMPAIRMENTS: Abnormal gait, difficulty walking, decreased ROM, decreased strength, impaired flexibility, obesity, and pain.   ACTIVITY LIMITATIONS: carrying, lifting, bending, standing, squatting, stairs, and locomotion level  PARTICIPATION LIMITATIONS: cleaning, shopping, community activity, occupation, and yard work  PERSONAL FACTORS: Age, Fitness, Profession, Time since onset of injury/illness/exacerbation, and 1 comorbidity: obesity  are also affecting patient's functional outcome.   REHAB POTENTIAL: Fair severity of osteoarthritis and chronicity of underlying MSK pathology   CLINICAL DECISION MAKING: Stable/uncomplicated  EVALUATION COMPLEXITY: Low   GOALS: Goals reviewed with patient? No  SHORT TERM GOALS: Target date: 11/06/2022  Pt will be independent with HEP in order to improve strength and balance in order to decrease fall risk and improve function at home and  work. Baseline: NT  Goal status: INITIAL  2.  Patient will develop a prophylactic pain management plan to better tolerate movement and activity without pain limiting her.  Baseline: Not taking any medications currently and she is limited by pain  Goal status: INITIAL  LONG TERM GOALS: Target date: 01/01/2023  Patient will have improved function and activity level as evidenced by an increase in FOTO score by 10 points or more.  Baseline: 66/100 with target of 72  Goal status: INITIAL  2.  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  Baseline: Hip Flex R/L 4/4, Hip Ext R/L 3/3-, Hip Abd R/L 4-/4-, Hip Add R/L 3-/3+ Goal status: INITIAL  3.  Pt will increase by at least 66m (153ft) in order to demonstrate clinically significant improvement in cardiopulmonary endurance and community ambulation Baseline: NT  Goal status: INITIAL  4.  Pt will decrease 5TSTS by at least 3 seconds in order to demonstrate clinically significant improvement in LE strength Baseline:  NT  Goal status: INITIAL  5.  Patient will perform >=10 reps of sit to stands for 30 sec chair stands to demonstrate age and gender matched norms for LE endurance that places her at a decreased risk for falls.  Baseline: NT  Goal status: INITIAL  6.  Patient will demonstrate reduced falls risk as evidenced by Dynamic Gait Index (DGI) >19/24. Baseline: NT  Goal status: INITIAL   PLAN:  PT FREQUENCY: 1-2x/week  PT DURATION: 10 weeks  PLANNED INTERVENTIONS: Therapeutic exercises, Neuromuscular re-education, Balance training, Gait training, Self Care, Joint mobilization, Joint manipulation, Stair training, DME instructions, Aquatic Therapy, Dry Needling, Spinal manipulation, Spinal mobilization, Cryotherapy, Moist heat, Manual therapy,  and Re-evaluation  PLAN FOR NEXT  SESSION: Functional testing: 5XSTS, 30 sec chair stands, DGI, , Progress hip strengthening exercises.   Ellin Goodie PT, DPT  10/23/2022, 11:27 AM

## 2022-10-25 ENCOUNTER — Ambulatory Visit: Payer: Medicare Other | Admitting: Physical Therapy

## 2022-10-29 ENCOUNTER — Ambulatory Visit: Payer: Medicare Other

## 2022-10-29 DIAGNOSIS — M25551 Pain in right hip: Secondary | ICD-10-CM

## 2022-10-29 DIAGNOSIS — R262 Difficulty in walking, not elsewhere classified: Secondary | ICD-10-CM | POA: Diagnosis not present

## 2022-10-29 NOTE — Therapy (Signed)
OUTPATIENT PHYSICAL THERAPY LOWER EXTREMITY TREATMENT   Patient Name: Nicole Jensen MRN: [id number] DOB:02-20-1951, 72 y.o., female Today's Date: 10/29/2022  END OF SESSION:  PT End of Session - 10/29/22 1021     Visit Number 2    Number of Visits 20    Date for PT Re-Evaluation 01/01/23    Authorization Type Blue Medicare    Authorization Time Period Based on [place]    Authorization - Visit Number 2    Authorization - Number of Visits 20    Progress Note Due on Visit 10    PT Start Time 1020    PT Stop Time 1050    PT Time Calculation (min) 30 min    Activity Tolerance Patient limited by pain    Behavior During Therapy [facility] for tasks assessed/performed             Past Medical History:  Diagnosis Date   Arthritis    Leg cramps    right leg   Past Surgical History:  Procedure Laterality Date   ABDOMINAL HYSTERECTOMY     partial. pt reports she has cervix   BARIATRIC SURGERY  2019   COLONOSCOPY N/A 03/07/2021   Procedure: COLONOSCOPY;  Surgeon: Toney Reil, MD;  Location: [facility] ENDOSCOPY;  Service: Gastroenterology;  Laterality: N/A;   COLONOSCOPY WITH PROPOFOL     Patient Active Problem List   Diagnosis Date Noted   Right hip pain 02/20/2022   History of anemia 02/20/2022   Prediabetes 11/02/2016   Allergic rhinitis 05/27/2015   Leiomyoma of uterus 05/27/2015   Hypercholesterolemia 05/27/2015   Anterior optic neuritis 05/27/2015   Avitaminosis D 05/27/2015    PCP: Dr. Shirlee Latch   REFERRING PROVIDER: Dr. Shirlee Latch   REFERRING DIAG: Right hip pain, right hip OA   THERAPY DIAG:  Pain in right hip  Difficulty in walking, not elsewhere classified  Rationale for Evaluation and Treatment: Rehabilitation  ONSET DATE: 10/22/2016   SUBJECTIVE:   SUBJECTIVE STATEMENT: Pt reports a "good day", 3/10 NPS in R hip pain. Needs to leave session early today to get to work.   PERTINENT HISTORY: Pt reports that she had a recent fall after  tripping over steps. However, she was referred for her right hip for severe OA. 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 She has been treating pain with cream and stopped taking tylenol extra strength. She had bariatric surgery in 2019 and she has been able to keep weight off since having surgery. 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  PAIN:  Are you having pain? Yes: NPRS scale: 3/10 Pain location: Right hip  Pain description: Stiff and achy  Aggravating factors: moving after sitting for a long period of time.  Relieving factors: Not moving or sitting and Thermowork Cream    PRECAUTIONS: None  WEIGHT BEARING RESTRICTIONS: No  FALLS:  Has patient fallen in last 6 months? Yes. 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   NEXT MD VISIT: Not sure   OBJECTIVE:  VITALS: BP 122/74 SpO2 100 HR 71   DIAGNOSTIC FINDINGS: CLINICAL DATA:  Right hip pain   EXAM: DG HIP (WITH OR WITHOUT PELVIS) 2-3V RIGHT   COMPARISON:  None Available.   FINDINGS: There is moderate-severe axial joint space narrowing of the right hip with osteophyte formation, subchondral sclerosis and cystic change. Moderate degenerative changes of the left hip. Moderate-severe degenerative changes of the symphysis pubis with subchondral sclerosis.   IMPRESSION: Moderate-severe osteoarthritis of the right hip.     Electronically Signed   By: Caprice Renshaw M.D.   On: 09/02/2022 10:23  PATIENT SURVEYS:  FOTO 66/100 with target 72    COGNITION: Overall cognitive status: Within functional limits for tasks assessed     SENSATION: WFL    MUSCLE LENGTH: Hamstrings: Right 90 deg; Left 70 deg Thomas test: Negative bilateral   POSTURE: rounded shoulders and forward head  PALPATION: Right and left greater trochanter TTP but inside hip (c- sign)  LOWER EXTREMITY ROM:  Active ROM Right eval Left eval  Hip flexion    Hip extension    Hip abduction    Hip adduction    Hip internal rotation    Hip external rotation    Knee flexion 120* 120*  Knee extension    Ankle dorsiflexion    Ankle plantarflexion    Ankle inversion    Ankle eversion     (Blank rows = not tested)         LOWER EXTREMITY MMT:  MMT Right eval Left eval  Hip flexion 4 4  Hip extension 3 3-  Hip abduction 4- 4-  Hip adduction 3- 3+  Hip internal rotation    Hip external rotation    Knee flexion 4 4  Knee extension 4 4  Ankle dorsiflexion 4 4  Ankle plantarflexion    Ankle inversion    Ankle eversion     (Blank rows = not tested)    LOWER EXTREMITY SPECIAL TESTS:  Hip special tests: Luisa Hart (FABER) test: positive , Ely's test: positive , and Anterior hip impingement test: positive   FUNCTIONAL TESTS:  5 times sit to stand: NT 30 seconds chair stand test 6 minute walk test: NT 10 meter walk test: NT Dynamic Gait Index: NT   GAIT: Distance walked: 50 ft  Assistive device utilized: None Level of assistance: Complete Independence Comments: Forward flexed posture and decreased stance time and step length on right foot    TODAY'S TREATMENT:                                                                                                                              DATE:   10/29/22:  5xSTS: 22.12 seconds needing LUE support : 860' no AD DGI: 13/24   Discussed HEP and general form/technique. Discussed results of 6 MWT and DGI placing pt at higher risk for falls.   PATIENT EDUCATION:  Education details:  form and technique for correct performance  of exercise  Person educated: Patient Education method: Explanation, Demonstration, Verbal cues, and Handouts Education comprehension: verbalized understanding, returned demonstration, and verbal cues required  HOME EXERCISE PROGRAM: Access Code: [email or web address] URL: [email or web address] Date: 10/23/2022 Prepared by: Ellin Goodie  Exercises - Seated Hamstring Stretch  - 1 x daily - 3 reps - 60 sec  hold - Supine Bridge  - 3 x weekly - 3 sets - 10 reps  ASSESSMENT:  CLINICAL IMPRESSION: Pt arriving to PT for f/u session after eval. Session limited to 30 minutes today as pt needed to leave early to get to work. Continuing assessment from eval with DGI, and 5xSTS. Pt's test results indicative of LE weakness and limited walking tolerance and balance impairments with community tasks placing pt at high risk for falls. Pt  may benefit from trialing gait with SPC. Pt may also benefit from hip AROM and PROM measurements to assess general hip mobility in setting of moderate to severe Hip OA as joint limitations may inhibit adequate ranges for gait tasks. Goals updated to reflect current POC. Pt will benefit from skilled PT services to address LE weakness, gait/balance abnormalities to reduce pain and reduced risk of falls.   OBJECTIVE IMPAIRMENTS: Abnormal gait, difficulty walking, decreased ROM, decreased strength, impaired flexibility, obesity, and pain.   ACTIVITY LIMITATIONS: carrying, lifting, bending, standing, squatting, stairs, and locomotion level  PARTICIPATION LIMITATIONS: cleaning, shopping, community activity, occupation, and yard work  PERSONAL FACTORS: Age, Fitness, Profession, Time since onset of injury/illness/exacerbation, and 1 comorbidity: obesity  are also affecting patient's functional outcome.   REHAB POTENTIAL: Fair severity of osteoarthritis and chronicity of underlying MSK pathology   CLINICAL DECISION MAKING:  Stable/uncomplicated  EVALUATION COMPLEXITY: Low   GOALS: Goals reviewed with patient? No  SHORT TERM GOALS: Target date: 11/06/2022  Pt will be independent with HEP in order to improve strength and balance in order to decrease fall risk and improve function at home and work. Baseline: NT  Goal status: INITIAL  2.  Patient will develop a prophylactic pain management plan to better tolerate movement and activity without pain limiting her.  Baseline: Not taking any medications currently and she is limited by pain  Goal status: INITIAL  LONG TERM GOALS: Target date: 01/01/2023  Patient will have improved function and activity level as evidenced by an increase in FOTO score by 10 points or more.  Baseline: 66/100 with target of 72  Goal status: INITIAL  2.  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  Baseline: Hip Flex R/L 4/4, Hip Ext R/L 3/3-, Hip Abd R/L 4-/4-, Hip Add R/L 3-/3+ Goal status: INITIAL  3.  Pt will increase by at least 12m (125ft) in order to demonstrate clinically significant improvement in cardiopulmonary endurance and community ambulation Baseline: 10/29/22: 860' Goal status: INITIAL  4.  Pt will decrease 5TSTS by at least 3 seconds in order to demonstrate clinically significant improvement in LE strength Baseline: 10/29/22:  22.12 seconds with LUE support on arm rest Goal status: INITIAL  5.  Patient will perform >=10 reps of sit to stands for 30 sec chair stands to demonstrate age and gender matched norms for LE endurance that places her at a decreased risk for falls.  Baseline: NT  Goal status: INITIAL  6.  Patient will demonstrate reduced falls risk as evidenced by Dynamic  Gait Index (DGI) >19/24. Baseline: 10/29/22: 13/24 Goal status: INITIAL   PLAN:  PT FREQUENCY: 1-2x/week  PT DURATION: 10  weeks  PLANNED INTERVENTIONS: Therapeutic exercises, Neuromuscular re-education, Balance training, Gait training, Self Care, Joint mobilization, Joint manipulation, Stair training, DME instructions, Aquatic Therapy, Dry Needling, Spinal manipulation, Spinal mobilization, Cryotherapy, Moist heat, Manual therapy, and Re-evaluation  PLAN FOR NEXT SESSION: 30 sec STS, hip A/PROM, review HEP, SPC training if primary PT thinks necessary.   Delphia Grates. Fairly IV, PT, DPT Physical Therapist- [facility]  10/29/2022, 10:55 AM

## 2022-11-01 ENCOUNTER — Ambulatory Visit: Payer: Medicare Other | Admitting: Physical Therapy

## 2022-11-06 ENCOUNTER — Ambulatory Visit: Payer: Medicare Other | Admitting: Physical Therapy

## 2022-11-06 DIAGNOSIS — M25551 Pain in right hip: Secondary | ICD-10-CM | POA: Diagnosis not present

## 2022-11-06 DIAGNOSIS — R262 Difficulty in walking, not elsewhere classified: Secondary | ICD-10-CM

## 2022-11-06 NOTE — Therapy (Signed)
OUTPATIENT PHYSICAL THERAPY LOWER EXTREMITY TREATMENT   Patient Name: Nicole Jensen MRN: [id number] DOB:[birth year], 72 y.o., female Today's Date: 11/06/2022  END OF SESSION:  PT End of Session - 11/06/22 1103     Visit Number 3    Number of Visits 20    Date for PT Re-Evaluation 01/01/23    Authorization Type Blue [employer]    Authorization Time Period Based on [place]    Authorization - Visit Number 3    Authorization - Number of Visits 20    Progress Note Due on Visit 10    PT Start Time 1015    PT Stop Time 1045    PT Time Calculation (min) 30 min    Activity Tolerance Patient tolerated treatment well    Behavior During Therapy WFL for tasks assessed/performed              Past Medical History:  Diagnosis Date   Arthritis    Leg cramps    right leg   Past Surgical History:  Procedure Laterality Date   ABDOMINAL HYSTERECTOMY     partial. pt reports she has cervix   BARIATRIC SURGERY  2019   COLONOSCOPY N/A 03/07/2021   Procedure: COLONOSCOPY;  Surgeon: Toney Reil, MD;  Location: [facility] ENDOSCOPY;  Service: Gastroenterology;  Laterality: N/A;   COLONOSCOPY WITH PROPOFOL     Patient Active Problem List   Diagnosis Date Noted   Right hip pain 02/20/2022   History of anemia 02/20/2022   Prediabetes 11/02/2016   Allergic rhinitis 05/27/2015   Leiomyoma of uterus 05/27/2015   Hypercholesterolemia 05/27/2015   Anterior optic neuritis 05/27/2015   Avitaminosis D 05/27/2015    PCP: Dr. Shirlee Latch   REFERRING PROVIDER: Dr. Shirlee Latch   REFERRING DIAG: Right hip pain, right hip OA   THERAPY DIAG:  Pain in right hip  Difficulty in walking, not elsewhere classified  Rationale for Evaluation and Treatment: Rehabilitation  ONSET DATE: 10/22/2016   SUBJECTIVE:   SUBJECTIVE STATEMENT: Pt states that she is able to all exercises without difficulty. She is having increased pain in her feet because of neuropathy and a recent podiatry procedure  where a bunion was removed.   PERTINENT HISTORY: Pt reports that she had a recent fall after tripping over steps. However, she was referred for her right hip for severe OA. 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 She has been treating pain with cream and stopped taking tylenol extra strength. She had bariatric surgery in 2019 and she has been able to keep weight off since having surgery. 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  PAIN:  Are you having pain? Yes: NPRS scale: 3/10 Pain location: Right hip  Pain description: Stiff and achy  Aggravating factors: moving after sitting for a long period of time.  Relieving factors: Not moving or sitting and Thermowork Cream    PRECAUTIONS: None  WEIGHT BEARING RESTRICTIONS: No  FALLS:  Has patient fallen in last 6 months? Yes. 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  NEXT MD VISIT: Not sure   OBJECTIVE:   VITALS: BP 122/74 SpO2 100 HR 71   DIAGNOSTIC FINDINGS: CLINICAL DATA:  Right hip pain   EXAM: DG HIP (WITH OR WITHOUT PELVIS) 2-3V RIGHT   COMPARISON:  None Available.   FINDINGS: There is moderate-severe axial joint space narrowing of the right hip with osteophyte formation, subchondral sclerosis and cystic change. Moderate degenerative changes of the left hip. Moderate-severe degenerative changes of the symphysis pubis with subchondral sclerosis.   IMPRESSION: Moderate-severe osteoarthritis of the right hip.     Electronically Signed   By: Caprice Renshaw  M.D.   On: 09/02/2022 10:23  PATIENT SURVEYS:  FOTO 66/100 with target 72   COGNITION: Overall cognitive status: Within functional limits for tasks assessed     SENSATION: WFL    MUSCLE LENGTH: Hamstrings: Right 90 deg; Left 70 deg Thomas test: Negative bilateral   POSTURE: rounded shoulders and forward head  PALPATION: Right and left greater trochanter TTP but inside hip (c- sign)  LOWER EXTREMITY ROM:  Active ROM Right eval Left eval  Hip flexion    Hip extension    Hip abduction    Hip adduction    Hip internal rotation 10 16  Hip external rotation 20 23  Knee flexion 120* 120*  Knee extension    Ankle dorsiflexion    Ankle plantarflexion    Ankle inversion    Ankle eversion     (Blank rows = not tested)         LOWER EXTREMITY MMT:  MMT Right eval Left eval  Hip flexion 4 4  Hip extension 3 3-  Hip abduction 4- 4-  Hip adduction 3- 3+  Hip internal rotation    Hip external rotation    Knee flexion 4 4  Knee extension 4 4  Ankle dorsiflexion 4 4  Ankle plantarflexion    Ankle inversion    Ankle eversion     (Blank rows = not tested)    LOWER EXTREMITY SPECIAL TESTS:  Hip special tests: Luisa Hart (FABER) test: positive , Ely's test: positive , and Anterior hip impingement test: positive   FUNCTIONAL TESTS:  5 times sit to stand: NT 30 seconds chair stand test 6 minute walk test: NT 10 meter walk test: NT Dynamic Gait Index: NT   GAIT: Distance walked: 50 ft  Assistive device utilized: None Level of assistance: Complete Independence Comments: Forward flexed posture and decreased stance time and step length on right foot    TODAY'S TREATMENT:                                                                                                                              DATE:   11/06/22: Nu-Step seat and arms at 8 for 5 min  Hip Ext in Standing: 15 degrees bilaterally Seated Hip ER 30 sec x 4  Hip Adduction Stretch with forward  lean 30 sec x 3  30  sec chair stands: 8 reps *Women 65-69 <11 indicates risk for fall  10/29/22:  5xSTS: 22.12 seconds needing LUE support : 860' no AD DGI: 13/24   Discussed HEP and general form/technique. Discussed results of 6 MWT and DGI placing pt at higher risk for falls.   PATIENT EDUCATION:  Education details: form and technique for correct performance of exercise  Person educated: Patient Education method: Explanation, Demonstration, Verbal cues, and Handouts Education comprehension: verbalized understanding, returned demonstration, and verbal cues required  HOME EXERCISE PROGRAM: Access Code: [email or web address] URL: [email or web address] Date: 11/06/2022 Prepared by: Ellin Goodie  Exercises - Seated Hamstring Stretch  - 1 x daily - 3 reps - 60 sec  hold - Supine Bridge  - 3 x weekly - 3 sets - 10 reps - Bound Angle Hands Forward   - 1 x daily - 3 reps - 60 sec  hold - Seated Hip External Rotation Stretch  - 1 x daily - 3 reps - 60 sec  hold  ASSESSMENT:  CLINICAL IMPRESSION:      Pt shows decreased LE endurance and decreased hip flexibility. She will continue to benefit from LE strengthening, flexibility, and endurance exercises. Sessions continued to be limited to 30 minutes due to patients need to leave early to be at work on time. PT trying to work with pt to schedule better appointment times to be able to utilize full appointment time. She does have enough hip mobility to carry out ambulation related tasks. Pt will benefit from skilled PT services to address LE weakness, gait/balance abnormalities to reduce pain and reduced risk of falls.      OBJECTIVE IMPAIRMENTS: Abnormal gait, difficulty walking, decreased ROM, decreased strength, impaired flexibility, obesity, and pain.   ACTIVITY LIMITATIONS: carrying, lifting, bending, standing, squatting, stairs, and locomotion level  PARTICIPATION LIMITATIONS: cleaning, shopping, community activity, occupation,  and yard work  PERSONAL FACTORS: Age, Fitness, Profession, Time since onset of injury/illness/exacerbation, and 1 comorbidity: obesity  are also affecting patient's functional outcome.   REHAB POTENTIAL: Fair severity of osteoarthritis and chronicity of underlying MSK pathology   CLINICAL DECISION MAKING: Stable/uncomplicated  EVALUATION COMPLEXITY: Low   GOALS: Goals reviewed with patient? No  SHORT TERM GOALS: Target date: 11/06/2022  Pt will be independent with HEP in order to improve strength and balance in order to decrease fall risk and improve function at home and work. Baseline: NT  Goal status: IN PROGRESS  2.  Patient will develop a prophylactic pain management plan to better tolerate movement and activity without pain limiting her.  Baseline: Not taking any medications currently and she is limited by pain  Goal status: IN PROGRESS   LONG TERM GOALS: Target date: 01/01/2023  Patient will have improved function and activity level as evidenced by an increase in FOTO score by 10 points or more.  Baseline: 66/100 with target of 72  Goal status: IN PROGRESS  2.  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  Baseline: Hip Flex R/L 4/4, Hip Ext R/L 3/3-, Hip Abd R/L 4-/4-, Hip Add R/L 3-/3+ Goal status: IN PROGRESS  3.  Pt will increase by at least 36m (128ft) in order to demonstrate clinically significant improvement in cardiopulmonary endurance and community ambulation Baseline: 10/29/22: 860' Goal status: IN PROGRESS  4.  Pt will decrease 5TSTS by at least 3 seconds  in order to demonstrate clinically significant improvement in LE strength Baseline: 10/29/22:  22.12 seconds with LUE support on arm rest Goal status: IN PROGRESS  5.  Patient will perform >=11 reps of sit to stands for 30 sec chair stands to demonstrate age  and gender matched norms for LE endurance that places her at a decreased risk for falls.  Baseline: 8 reps  Goal status: IN PROGRESS  6.  Patient will demonstrate reduced falls risk as evidenced by Dynamic Gait Index (DGI) >19/24. Baseline: 10/29/22: 13/24 Goal status: IN PROGRESS   PLAN:  PT FREQUENCY: 1-2x/week  PT DURATION: 10 weeks  PLANNED INTERVENTIONS: Therapeutic exercises, Neuromuscular re-education, Balance training, Gait training, Self Care, Joint mobilization, Joint manipulation, Stair training, DME instructions, Aquatic Therapy, Dry Needling, Spinal manipulation, Spinal mobilization, Cryotherapy, Moist heat, Manual therapy, and Re-evaluation  PLAN FOR NEXT SESSION:  Trial single point cane and complete gait training and dynamic balance tasks. Start session with hip stretching  Ellin Goodie PT, DPT  Physical Therapist- [facility]  [facility]  11/06/2022, 11:04 AM

## 2022-11-08 ENCOUNTER — Ambulatory Visit: Payer: Medicare Other | Admitting: Physical Therapy

## 2022-11-08 ENCOUNTER — Encounter: Payer: Self-pay | Admitting: Physical Therapy

## 2022-11-08 DIAGNOSIS — M25551 Pain in right hip: Secondary | ICD-10-CM

## 2022-11-08 DIAGNOSIS — R262 Difficulty in walking, not elsewhere classified: Secondary | ICD-10-CM

## 2022-11-08 NOTE — Therapy (Signed)
OUTPATIENT PHYSICAL THERAPY LOWER EXTREMITY TREATMENT   Patient Name: Nicole Jensen MRN: [id number] DOB:1951/02/18, 72 y.o., female Today's Date: 11/08/2022  END OF SESSION:  PT End of Session - 11/08/22 1022     Visit Number 4    Number of Visits 20    Date for PT Re-Evaluation 01/01/23    Authorization Type Blue Medicare    Authorization Time Period Based on [place]    Authorization - Visit Number 4    Authorization - Number of Visits 20    Progress Note Due on Visit 10    PT Start Time 1020    PT Stop Time 1100    PT Time Calculation (min) 40 min    Activity Tolerance Patient tolerated treatment well    Behavior During Therapy WFL for tasks assessed/performed              Past Medical History:  Diagnosis Date   Arthritis    Leg cramps    right leg   Past Surgical History:  Procedure Laterality Date   ABDOMINAL HYSTERECTOMY     partial. pt reports she has cervix   BARIATRIC SURGERY  2019   COLONOSCOPY N/A 03/07/2021   Procedure: COLONOSCOPY;  Surgeon: Toney Reil, MD;  Location: [facility] ENDOSCOPY;  Service: Gastroenterology;  Laterality: N/A;   COLONOSCOPY WITH PROPOFOL     Patient Active Problem List   Diagnosis Date Noted   Right hip pain 02/20/2022   History of anemia 02/20/2022   Prediabetes 11/02/2016   Allergic rhinitis 05/27/2015   Leiomyoma of uterus 05/27/2015   Hypercholesterolemia 05/27/2015   Anterior optic neuritis 05/27/2015   Avitaminosis D 05/27/2015    PCP: Dr. Shirlee Latch   REFERRING PROVIDER: Dr. Shirlee Latch   REFERRING DIAG: Right hip pain, right hip OA   THERAPY DIAG:  Pain in right hip  Difficulty in walking, not elsewhere classified  Rationale for Evaluation and Treatment: Rehabilitation  ONSET DATE: 10/22/2016   SUBJECTIVE:   SUBJECTIVE STATEMENT: Pt reports that she felt that exercises have been going well especially the hip stretching exercise.    PERTINENT HISTORY: Pt reports that she had a recent  fall after tripping over steps. However, she was referred for her right hip for severe OA. 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 She has been treating pain with cream and stopped taking tylenol extra strength. She had bariatric surgery in 2019 and she has been able to keep weight off since having surgery. 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  PAIN:  Are you having pain? Yes: NPRS scale: 3/10 Pain location: Right hip  Pain description: Stiff and achy  Aggravating factors: moving after sitting for a long period of time.  Relieving factors: Not moving or sitting and Thermowork Cream    PRECAUTIONS: None  WEIGHT BEARING RESTRICTIONS: No  FALLS:  Has patient fallen in last 6 months? Yes. 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   NEXT MD VISIT: Not sure   OBJECTIVE:   VITALS: BP 122/74  SpO2 100 HR 71   DIAGNOSTIC FINDINGS: CLINICAL DATA:  Right hip pain   EXAM: DG HIP (WITH OR WITHOUT PELVIS) 2-3V RIGHT   COMPARISON:  None Available.   FINDINGS: There is moderate-severe axial joint space narrowing of the right hip with osteophyte formation, subchondral sclerosis and cystic change. Moderate degenerative changes of the left hip. Moderate-severe degenerative changes of the symphysis pubis with subchondral sclerosis.   IMPRESSION: Moderate-severe osteoarthritis of the right hip.     Electronically Signed   By: Caprice Renshaw M.D.   On: 09/02/2022 10:23  PATIENT SURVEYS:  FOTO 66/100 with target 72    COGNITION: Overall cognitive status: Within functional limits for tasks assessed     SENSATION: WFL    MUSCLE LENGTH: Hamstrings: Right 90 deg; Left 70 deg Thomas test: Negative bilateral   POSTURE: rounded shoulders and forward head  PALPATION: Right and left greater trochanter TTP but inside hip (c- sign)  LOWER EXTREMITY ROM:  Active ROM Right eval Left eval  Hip flexion    Hip extension    Hip abduction    Hip adduction    Hip internal rotation 10 16  Hip external rotation 20 23  Knee flexion 120* 120*  Knee extension    Ankle dorsiflexion    Ankle plantarflexion    Ankle inversion    Ankle eversion     (Blank rows = not tested)         LOWER EXTREMITY MMT:  MMT Right eval Left eval  Hip flexion 4 4  Hip extension 3 3-  Hip abduction 4- 4-  Hip adduction 3- 3+  Hip internal rotation    Hip external rotation    Knee flexion 4 4  Knee extension 4 4  Ankle dorsiflexion 4 4  Ankle plantarflexion    Ankle inversion    Ankle eversion     (Blank rows = not tested)    LOWER EXTREMITY SPECIAL TESTS:  Hip special tests: Luisa Hart (FABER) test: positive , Ely's test: positive , and Anterior hip impingement test: positive   FUNCTIONAL TESTS:  5 times sit to stand: NT 30 seconds chair stand test 6 minute walk test: NT 10 meter walk test: NT Dynamic Gait Index: NT   GAIT: Distance walked: 50 ft  Assistive device utilized: None Level of assistance: Complete Independence Comments: Forward flexed posture and decreased stance time and step length on right foot    TODAY'S TREATMENT:                                                                                                                              DATE:   11/08/22: Nu-Step seat and arms at 9 for 5 min  Seated Hip ER stretch 3 x 30 sec  -min VC to maintain forward lean and not to bounce  Seated HS Stretch 2 x 30 sec  Mini-Squat with BUE support 1 x 10 from 25 inch  Sit to  Stand from  22 inch mat height 22 inch  -Pt shows slight posterior lean   11/06/22: Nu-Step seat and arms at 8 for 5 min  Hip Ext in Standing: 15 degrees bilaterally Seated Hip ER 30 sec x 4  Hip Adduction Stretch with forward lean 30 sec x 3  30 sec chair stands: 8 reps *Women 65-69 <11 indicates risk for fall  10/29/22:  5xSTS: 22.12 seconds needing LUE support : 860' no AD DGI: 13/24   Discussed HEP and general form/technique. Discussed results of 6 MWT and DGI placing pt at higher risk for falls.   PATIENT EDUCATION:  Education details: form and technique for correct performance of exercise  Person educated: Patient Education method: Explanation, Demonstration, Verbal cues, and Handouts Education comprehension: verbalized understanding, returned demonstration, and verbal cues required  HOME EXERCISE PROGRAM: Access Code: [email or web address] URL: [email or web address] Date: 11/08/2022 Prepared by: Ellin Goodie  Exercises - Seated Hamstring Stretch  - 1 x daily - 3 reps - 60 sec  hold - Bound Angle Hands Forward   - 1 x daily - 3 reps - 60 sec  hold - Seated Hip External Rotation Stretch  - 1 x daily - 3 reps - 60 sec  hold - Sit to Stand with Counter Support  - 3 x weekly - 3 sets - 10 reps  ASSESSMENT:  CLINICAL IMPRESSION:  Pt shows improvement with LE strength with ability to perform sit to stand from chair height. HEP modified to include sit to stands instead of bridges because of soft bed surface. PT provided pt with snack to avoid low blood sugar. Pt will benefit from skilled PT services to address LE weakness, gait/balance abnormalities to reduce pain and reduced risk of falls.    OBJECTIVE IMPAIRMENTS: Abnormal gait, difficulty walking, decreased ROM, decreased strength, impaired flexibility, obesity, and pain.   ACTIVITY LIMITATIONS: carrying, lifting, bending, standing, squatting, stairs, and locomotion level  PARTICIPATION LIMITATIONS: cleaning, shopping,  community activity, occupation, and yard work  PERSONAL FACTORS: Age, Fitness, Profession, Time since onset of injury/illness/exacerbation, and 1 comorbidity: obesity  are also affecting patient's functional outcome.   REHAB POTENTIAL: Fair severity of osteoarthritis and chronicity of underlying MSK pathology   CLINICAL DECISION MAKING: Stable/uncomplicated  EVALUATION COMPLEXITY: Low   GOALS: Goals reviewed with patient? No  SHORT TERM GOALS: Target date: 11/06/2022  Pt will be independent with HEP in order to improve strength and balance in order to decrease fall risk and improve function at home and work. Baseline: Unable to perform independently Goal status: IN PROGRESS  2.  Patient will develop a prophylactic pain management plan to better tolerate movement and activity without pain limiting her.  Baseline: Not taking any medications currently and she is limited by pain  Goal status: IN PROGRESS   LONG TERM GOALS: Target date: 01/01/2023  Patient will have improved function and activity level as evidenced by an increase in FOTO score by 10 points or more.  Baseline: 66/100 with target of 72  Goal status: IN PROGRESS  2.  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  Baseline: Hip Flex R/L 4/4, Hip Ext R/L 3/3-, Hip Abd R/L 4-/4-, Hip Add R/L 3-/3+ Goal status: IN PROGRESS  3.  Pt will increase by at least 31m (127ft) in order to demonstrate clinically significant improvement in cardiopulmonary  endurance and community ambulation Baseline: 10/29/22: 860' Goal status: IN PROGRESS  4.  Pt will decrease 5TSTS by at least 3 seconds in order to demonstrate clinically significant improvement in LE strength Baseline: 10/29/22:  22.12 seconds with LUE support on arm rest Goal status: IN PROGRESS  5.  Patient will perform >=11 reps of  sit to stands for 30 sec chair stands to demonstrate age and gender matched norms for LE endurance that places her at a decreased risk for falls.  Baseline: 8 reps  Goal status: IN PROGRESS  6.  Patient will demonstrate reduced falls risk as evidenced by Dynamic Gait Index (DGI) >19/24. Baseline: 10/29/22: 13/24 Goal status: IN PROGRESS   PLAN:  PT FREQUENCY: 1-2x/week  PT DURATION: 10 weeks  PLANNED INTERVENTIONS: Therapeutic exercises, Neuromuscular re-education, Balance training, Gait training, Self Care, Joint mobilization, Joint manipulation, Stair training, DME instructions, Aquatic Therapy, Dry Needling, Spinal manipulation, Spinal mobilization, Cryotherapy, Moist heat, Manual therapy, and Re-evaluation  PLAN FOR NEXT SESSION:  Trial single point cane and complete gait training and dynamic balance tasks. Continue with hip strengthening exercises including SLR.   Ellin Goodie PT, DPT  Physical Therapist- [facility]  11/08/2022, 10:23 AM

## 2022-11-14 ENCOUNTER — Telehealth: Payer: Self-pay | Admitting: Physical Therapy

## 2022-11-14 ENCOUNTER — Ambulatory Visit: Payer: Medicare Other | Attending: Family Medicine | Admitting: Physical Therapy

## 2022-11-14 DIAGNOSIS — R262 Difficulty in walking, not elsewhere classified: Secondary | ICD-10-CM | POA: Insufficient documentation

## 2022-11-14 DIAGNOSIS — M25551 Pain in right hip: Secondary | ICD-10-CM | POA: Insufficient documentation

## 2022-11-14 NOTE — Telephone Encounter (Signed)
Called pt to inquire about absence from PT. Pt reports that she thought she only had an apt on Thursday and that she was sorry for missing apt. PT reminded pt of next visit on this coming Thursday and pt confirmed understanding.

## 2022-11-15 ENCOUNTER — Ambulatory Visit: Payer: Medicare Other | Admitting: Physical Therapy

## 2022-11-15 ENCOUNTER — Encounter: Payer: Self-pay | Admitting: Physical Therapy

## 2022-11-15 DIAGNOSIS — M25551 Pain in right hip: Secondary | ICD-10-CM

## 2022-11-15 DIAGNOSIS — R262 Difficulty in walking, not elsewhere classified: Secondary | ICD-10-CM | POA: Diagnosis not present

## 2022-11-15 NOTE — Therapy (Signed)
OUTPATIENT PHYSICAL THERAPY LOWER EXTREMITY TREATMENT   Patient Name: Nicole Jensen MRN: [id number] DOB:1951/05/06, 72 y.o., female Today's Date: 11/08/2022  END OF SESSION:  PT End of Session - 11/08/22 1022     Visit Number 4    Number of Visits 20    Date for PT Re-Evaluation 01/01/23    Authorization Type Blue [employer]    Authorization Time Period Based on [place]    Authorization - Visit Number 4    Authorization - Number of Visits 20    Progress Note Due on Visit 10    PT Start Time 1020    PT Stop Time 1100    PT Time Calculation (min) 40 min    Activity Tolerance Patient tolerated treatment well    Behavior During Therapy WFL for tasks assessed/performed              Past Medical History:  Diagnosis Date   Arthritis    Leg cramps    right leg   Past Surgical History:  Procedure Laterality Date   ABDOMINAL HYSTERECTOMY     partial. pt reports she has cervix   BARIATRIC SURGERY  2019   COLONOSCOPY N/A 03/07/2021   Procedure: COLONOSCOPY;  Surgeon: Toney Reil, MD;  Location: [facility] ENDOSCOPY;  Service: Gastroenterology;  Laterality: N/A;   COLONOSCOPY WITH PROPOFOL     Patient Active Problem List   Diagnosis Date Noted   Right hip pain 02/20/2022   History of anemia 02/20/2022   Prediabetes 11/02/2016   Allergic rhinitis 05/27/2015   Leiomyoma of uterus 05/27/2015   Hypercholesterolemia 05/27/2015   Anterior optic neuritis 05/27/2015   Avitaminosis D 05/27/2015    PCP: Dr. Shirlee Latch   REFERRING PROVIDER: Dr. Shirlee Latch   REFERRING DIAG: Right hip pain, right hip OA   THERAPY DIAG:  Pain in right hip  Difficulty in walking, not elsewhere classified  Rationale for Evaluation and Treatment: Rehabilitation  ONSET DATE: 10/22/2016   SUBJECTIVE:   SUBJECTIVE STATEMENT: Pt reports that exercises have been going well and that stretches have been going well. She is on her feet most of the time because of her jobs.   PERTINENT  HISTORY: Pt reports that she had a recent fall after tripping over steps. However, she was referred for her right hip for severe OA. 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 She has been treating pain with cream and stopped taking tylenol extra strength. She had bariatric surgery in 2019 and she has been able to keep weight off since having surgery. 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  PAIN:  Are you having pain? No  PRECAUTIONS: None  WEIGHT BEARING RESTRICTIONS: No  FALLS:  Has patient fallen in last 6 months? Yes. 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   NEXT MD VISIT: Not sure   OBJECTIVE:   VITALS: BP 122/74 SpO2 100 HR 71   DIAGNOSTIC FINDINGS: CLINICAL DATA:  Right hip pain   EXAM: DG HIP (WITH OR WITHOUT PELVIS) 2-3V RIGHT  COMPARISON:  None Available.   FINDINGS: There is moderate-severe axial joint space narrowing of the right hip with osteophyte formation, subchondral sclerosis and cystic change. Moderate degenerative changes of the left hip. Moderate-severe degenerative changes of the symphysis pubis with subchondral sclerosis.   IMPRESSION: Moderate-severe osteoarthritis of the right hip.     Electronically Signed   By: Caprice Renshaw M.D.   On: 09/02/2022 10:23  PATIENT SURVEYS:  FOTO 66/100 with target 72   COGNITION: Overall cognitive status: Within functional limits for tasks assessed     SENSATION: WFL    MUSCLE LENGTH: Hamstrings: Right 90 deg; Left 70 deg Thomas test:  Negative bilateral   POSTURE: rounded shoulders and forward head  PALPATION: Right and left greater trochanter TTP but inside hip (c- sign)  LOWER EXTREMITY ROM:  Active ROM Right eval Left eval  Hip flexion    Hip extension    Hip abduction    Hip adduction    Hip internal rotation 10 16  Hip external rotation 20 23  Knee flexion 120* 120*  Knee extension    Ankle dorsiflexion    Ankle plantarflexion    Ankle inversion    Ankle eversion     (Blank rows = not tested)         LOWER EXTREMITY MMT:  MMT Right eval Left eval  Hip flexion 4 4  Hip extension 3 3-  Hip abduction 4- 4-  Hip adduction 3- 3+  Hip internal rotation    Hip external rotation    Knee flexion 4 4  Knee extension 4 4  Ankle dorsiflexion 4 4  Ankle plantarflexion    Ankle inversion    Ankle eversion     (Blank rows = not tested)    LOWER EXTREMITY SPECIAL TESTS:  Hip special tests: Luisa Hart (FABER) test: positive , Ely's test: positive , and Anterior hip impingement test: positive   FUNCTIONAL TESTS:  5 times sit to stand: NT 30 seconds chair stand test 6 minute walk test: NT 10 meter walk test: NT Dynamic Gait Index: NT   GAIT: Distance walked: 50 ft  Assistive device utilized: None Level of assistance: Complete Independence Comments: Forward flexed posture and decreased stance time and step length on right foot    TODAY'S TREATMENT:                                                                                                                              DATE:   11/15/22:  Nu-Step seat and arms at 9 for 5 min  SLR 3 x 10  -Pt instructed to bend right knee because of weakness, but eventually able to straighten right knee   Abdominal Crunches with crossed arms 3 x 10  Sit to Stand at 18 inch with no use of arms 2 x 10  Seated Hip ER stretch 4 x 30 sec  Seated HS Stretch 4 x 30 sec   11/08/22: Nu-Step  seat and arms at 9 for 5 min  Seated Hip ER stretch 3 x 30 sec  -min  VC to maintain forward lean and not to bounce  Seated HS Stretch 2 x 30 sec  Mini-Squat with BUE support 1 x 10 from 25 inch  Sit to Stand from 22 inch mat height 22 inch  -Pt shows slight posterior lean   11/06/22: Nu-Step seat and arms at 8 for 5 min  Hip Ext in Standing: 15 degrees bilaterally Seated Hip ER 30 sec x 4  Hip Adduction Stretch with forward lean 30 sec x 3  30 sec chair stands: 8 reps *Women 65-69 <11 indicates risk for fall  10/29/22:  5xSTS: 22.12 seconds needing LUE support : 860' no AD DGI: 13/24   Discussed HEP and general form/technique. Discussed results of 6 MWT and DGI placing pt at higher risk for falls.   PATIENT EDUCATION:  Education details: form and technique for correct performance of exercise  Person educated: Patient Education method: Explanation, Demonstration, Verbal cues, and Handouts Education comprehension: verbalized understanding, returned demonstration, and verbal cues required  HOME EXERCISE PROGRAM: Access Code: [email or web address] URL: [email or web address] Date: 11/08/2022 Prepared by: Ellin Goodie  Exercises - Seated Hamstring Stretch  - 1 x daily - 3 reps - 60 sec  hold - Bound Angle Hands Forward   - 1 x daily - 3 reps - 60 sec  hold - Seated Hip External Rotation Stretch  - 1 x daily - 3 reps - 60 sec  hold - Sit to Stand with Counter Support  - 3 x weekly - 3 sets - 10 reps  ASSESSMENT:  CLINICAL IMPRESSION: Pt demonstrates improvement in LE strength with ability to perform sit to stand without UE support. She does show decreased right hip flexor strength with difficulty performing straight leg raise compared to left leg requiring bent knee to raise RLE. She will continue to benefit from skilled PT services to address LE weakness, gait/balance abnormalities to reduce pain and reduced risk of falls.  OBJECTIVE IMPAIRMENTS: Abnormal gait, difficulty walking, decreased ROM, decreased strength, impaired flexibility,  obesity, and pain.   ACTIVITY LIMITATIONS: carrying, lifting, bending, standing, squatting, stairs, and locomotion level  PARTICIPATION LIMITATIONS: cleaning, shopping, community activity, occupation, and yard work  PERSONAL FACTORS: Age, Fitness, Profession, Time since onset of injury/illness/exacerbation, and 1 comorbidity: obesity  are also affecting patient's functional outcome.   REHAB POTENTIAL: Fair severity of osteoarthritis and chronicity of underlying MSK pathology   CLINICAL DECISION MAKING: Stable/uncomplicated  EVALUATION COMPLEXITY: Low   GOALS: Goals reviewed with patient? No  SHORT TERM GOALS: Target date: 11/06/2022  Pt will be independent with HEP in order to improve strength and balance in order to decrease fall risk and improve function at home and work. Baseline: Unable to perform independently Goal status: IN PROGRESS  2.  Patient will develop a prophylactic pain management plan to better tolerate movement and activity without pain limiting her.  Baseline: Not taking any medications currently and she is limited by pain  Goal status: IN PROGRESS   LONG TERM GOALS: Target date: 01/01/2023  Patient will have improved function and activity level as evidenced by an increase in FOTO score by 10 points or more.  Baseline: 66/100 with target of 72  Goal status: IN PROGRESS  2.  Patient will improve hip MMT by 1/3 grade MMT (ie 4- to 4) to provide additional stability to low back to perform physical tasks for job cleaning rooms at school  with less pain and discomfort.  Baseline: Hip Flex R/L 4/4, Hip Ext R/L 3/3-, Hip Abd R/L 4-/4-, Hip Add R/L 3-/3+ Goal status: IN PROGRESS  3.  Pt will increase by at least 38m (110ft) in order to demonstrate clinically significant improvement in cardiopulmonary endurance and community ambulation Baseline: 10/29/22: 860' Goal status: IN PROGRESS  4.  Pt will decrease 5TSTS by at least 3 seconds in order to demonstrate  clinically significant improvement in LE strength Baseline: 10/29/22:  22.12 seconds with LUE support on arm rest Goal status: IN PROGRESS  5.  Patient will perform >=11 reps of sit to stands for 30 sec chair stands to demonstrate age and gender matched norms for LE endurance that places her at a decreased risk for falls.  Baseline: 8 reps  Goal status: IN PROGRESS  6.  Patient will demonstrate reduced falls risk as evidenced by Dynamic Gait Index (DGI) >19/24. Baseline: 10/29/22: 13/24 Goal status: IN PROGRESS   PLAN:  PT FREQUENCY: 1-2x/week  PT DURATION: 10 weeks  PLANNED INTERVENTIONS: Therapeutic exercises, Neuromuscular re-education, Balance training, Gait training, Self Care, Joint mobilization, Joint manipulation, Stair training, DME instructions, Aquatic Therapy, Dry Needling, Spinal manipulation, Spinal mobilization, Cryotherapy, Moist heat, Manual therapy, and Re-evaluation  PLAN FOR NEXT SESSION:  Continue to progress hip strengthening exercises while also incorporating parascapular and low back strengthening exercises.   Ellin Goodie PT, DPT  Physical Therapist- [facility]  11/08/2022, 10:23 AM

## 2022-11-20 ENCOUNTER — Encounter: Payer: Self-pay | Admitting: Physical Therapy

## 2022-11-20 ENCOUNTER — Ambulatory Visit: Payer: Medicare Other | Admitting: Physical Therapy

## 2022-11-20 DIAGNOSIS — R262 Difficulty in walking, not elsewhere classified: Secondary | ICD-10-CM

## 2022-11-20 DIAGNOSIS — M25551 Pain in right hip: Secondary | ICD-10-CM

## 2022-11-20 NOTE — Therapy (Signed)
OUTPATIENT PHYSICAL THERAPY LOWER EXTREMITY TREATMENT   Patient Name: Nicole Jensen MRN: [id number] DOB:1950/11/04, 72 y.o., female Today's Date: 11/20/2022  END OF SESSION:  PT End of Session - 11/20/22 0956     Visit Number 6    Number of Visits 20    Date for PT Re-Evaluation 01/01/23    Authorization Type Blue Medicare    Authorization Time Period Based on [place]    Authorization - Visit Number 6    Authorization - Number of Visits 20    Progress Note Due on Visit 10    PT Start Time 0950    PT Stop Time 1030    PT Time Calculation (min) 40 min    Activity Tolerance Patient tolerated treatment well    Behavior During Therapy WFL for tasks assessed/performed              Past Medical History:  Diagnosis Date   Arthritis    Leg cramps    right leg   Past Surgical History:  Procedure Laterality Date   ABDOMINAL HYSTERECTOMY     partial. pt reports she has cervix   BARIATRIC SURGERY  2019   COLONOSCOPY N/A 03/07/2021   Procedure: COLONOSCOPY;  Surgeon: Toney Reil, MD;  Location: [facility] ENDOSCOPY;  Service: Gastroenterology;  Laterality: N/A;   COLONOSCOPY WITH PROPOFOL     Patient Active Problem List   Diagnosis Date Noted   Right hip pain 02/20/2022   History of anemia 02/20/2022   Prediabetes 11/02/2016   Allergic rhinitis 05/27/2015   Leiomyoma of uterus 05/27/2015   Hypercholesterolemia 05/27/2015   Anterior optic neuritis 05/27/2015   Avitaminosis D 05/27/2015    PCP: Dr. Shirlee Latch   REFERRING PROVIDER: Dr. Shirlee Latch   REFERRING DIAG: Right hip pain, right hip OA   THERAPY DIAG:  Pain in right hip  Difficulty in walking, not elsewhere classified  Rationale for Evaluation and Treatment: Rehabilitation  ONSET DATE: 10/22/2016   SUBJECTIVE:   SUBJECTIVE STATEMENT: Pt c/o of increased cramps and that she experiences these mostly at night. She drinks plenty of water and she is not sure what is causing cramps. She is  experiencing these cramps when she is laying flat in bed and does not experience them as much when propped up.   PERTINENT HISTORY: Pt reports that she had a recent fall after tripping over steps. However, she was referred for her right hip for severe OA. 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 She has been treating pain with cream and stopped taking tylenol extra strength. She had bariatric surgery in 2019 and she has been able to keep weight off since having surgery. 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  PAIN:  Are you having pain? No  PRECAUTIONS: None  WEIGHT BEARING RESTRICTIONS: No  FALLS:  Has patient fallen in last 6 months? Yes. 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   NEXT MD VISIT: Not sure   OBJECTIVE:   VITALS: BP 122/74 SpO2 100 HR 71  DIAGNOSTIC FINDINGS: CLINICAL DATA:  Right hip pain   EXAM: DG HIP (WITH OR WITHOUT PELVIS) 2-3V RIGHT   COMPARISON:  None Available.   FINDINGS: There is moderate-severe axial joint space narrowing of the right hip with osteophyte formation, subchondral sclerosis and cystic change. Moderate degenerative changes of the left hip. Moderate-severe degenerative changes of the symphysis pubis with subchondral sclerosis.   IMPRESSION: Moderate-severe osteoarthritis of the right hip.     Electronically Signed   By: Caprice Renshaw M.D.   On: 09/02/2022 10:23  PATIENT SURVEYS:  FOTO 66/100 with target 72   COGNITION: Overall cognitive status: Within  functional limits for tasks assessed     SENSATION: WFL    MUSCLE LENGTH: Hamstrings: Right 90 deg; Left 70 deg Thomas test: Negative bilateral   POSTURE: rounded shoulders and forward head  PALPATION: Right and left greater trochanter TTP but inside hip (c- sign)  LOWER EXTREMITY ROM:  Active ROM Right eval Left eval  Hip flexion    Hip extension    Hip abduction    Hip adduction    Hip internal rotation 10 16  Hip external rotation 20 23  Knee flexion 120* 120*  Knee extension    Ankle dorsiflexion    Ankle plantarflexion    Ankle inversion    Ankle eversion     (Blank rows = not tested)         LOWER EXTREMITY MMT:  MMT Right eval Left eval  Hip flexion 4 4  Hip extension 3 3-  Hip abduction 4- 4-  Hip adduction 3- 3+  Hip internal rotation    Hip external rotation    Knee flexion 4 4  Knee extension 4 4  Ankle dorsiflexion 4 4  Ankle plantarflexion    Ankle inversion    Ankle eversion     (Blank rows = not tested)    LOWER EXTREMITY SPECIAL TESTS:  Hip special tests: Luisa Hart (FABER) test: positive , Ely's test: positive , and Anterior hip impingement test: positive   FUNCTIONAL TESTS:  5 times sit to stand: NT 30 seconds chair stand test 6 minute walk test: NT 10 meter walk test: NT Dynamic Gait Index: NT   GAIT: Distance walked: 50 ft  Assistive device utilized: None Level of assistance: Complete Independence Comments: Forward flexed posture and decreased stance time and step length on right foot    TODAY'S TREATMENT:                                                                                                                              DATE:   11/20/22: Nu-Step seat and arms at 9 for 5 min  Sit to Stand from 18 inch surface 1 x 10  Sit to Stand from 18 inch surface with 8 lb water gallon 2 x 10  10 m- (3 x obstacle step over) x 4  10 m (5 x obstacle step over) x 4  -  min VC to maintain constant speed  Straight Leg Raise  3 x 10  -Pt reports increased pain that radiates down right quad  Modified Thomas Stretch with PT providing overpressure 3 x 30 sec on RLE   11/15/22:  Nu-Step seat and arms at 9 for 5 min  SLR 3 x 10  -Pt instructed to bend right knee because of weakness, but eventually able to straighten right knee   Abdominal Crunches with crossed arms 3 x 10  Sit to Stand at 18 inch with no use of arms 2 x 10  Seated Hip ER stretch 4 x 30 sec  Seated HS Stretch 4 x 30 sec   11/08/22: Nu-Step seat and arms at 9 for 5 min  Seated Hip ER stretch 3 x 30 sec  -min VC to maintain forward lean and not to bounce  Seated HS Stretch 2 x 30 sec  Mini-Squat with BUE support 1 x 10 from 25 inch  Sit to Stand from 22 inch mat height 22 inch  -Pt shows slight posterior lean    PATIENT EDUCATION:  Education details: form and technique for correct performance of exercise  Person educated: Patient Education method: Explanation, Demonstration, Verbal cues, and Handouts Education comprehension: verbalized understanding, returned demonstration, and verbal cues required  HOME EXERCISE PROGRAM: Access Code: [email or web address] URL: [email or web address] Date: 11/20/2022 Prepared by: Ellin Goodie  Exercises - Seated Hamstring Stretch  - 1 x daily - 3 reps - 60 sec  hold - Modified Thomas Stretch  - 1 x daily - 3 reps - 30-60 sec  hold - Bound Angle Hands Forward   - 1 x daily - 3 reps - 60 sec  hold - Seated Hip External Rotation Stretch  - 1 x daily - 3 reps - 60 sec  hold - Supine Active Straight Leg Raise  - 3 x weekly - 3 sets - 10 reps - Curl Up with Arms Crossed  - 3 x weekly - 3 sets - 10 reps - Sit to Stand  - 3 x weekly - 3 sets - 10 reps  ASSESSMENT:  CLINICAL IMPRESSION: Pt shows improved LE strength with ability to perform sit to stand with increased resistance. Pt was somewhat limited by tight hip flexor, but stretching resolved pain and pt was able to complete progressed straight leg raise  with less difficulty. Unable to reproduce cramps and likely not neurological with treadmill test not yielding provocation of cramps indicating lumbar stenosis. She will continue to benefit from skilled PT services to address LE weakness, gait/balance abnormalities to reduce pain and reduced risk of falls.  OBJECTIVE IMPAIRMENTS: Abnormal gait, difficulty walking, decreased ROM, decreased strength, impaired flexibility, obesity, and pain.   ACTIVITY LIMITATIONS: carrying, lifting, bending, standing, squatting, stairs, and locomotion level  PARTICIPATION LIMITATIONS: cleaning, shopping, community activity, occupation, and yard work  PERSONAL FACTORS: Age, Fitness, Profession, Time since onset of injury/illness/exacerbation, and 1 comorbidity: obesity  are also affecting patient's functional outcome.   REHAB POTENTIAL: Fair severity of osteoarthritis and chronicity of underlying MSK pathology   CLINICAL DECISION MAKING: Stable/uncomplicated  EVALUATION COMPLEXITY: Low   GOALS: Goals reviewed with patient? No  SHORT TERM GOALS: Target date: 11/06/2022  Pt will be independent with HEP in order to improve strength and balance in order to decrease fall risk and improve function at home and work. Baseline: Unable to perform independently Goal status: IN PROGRESS  2.  Patient will develop a prophylactic pain management plan to better tolerate movement and  activity without pain limiting her.  Baseline: Not taking any medications currently and she is limited by pain  Goal status: IN PROGRESS   LONG TERM GOALS: Target date: 01/01/2023  Patient will have improved function and activity level as evidenced by an increase in FOTO score by 10 points or more.  Baseline: 66/100 with target of 72  Goal status: IN PROGRESS  2.  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  Baseline: Hip Flex R/L 4/4, Hip Ext R/L 3/3-, Hip Abd R/L 4-/4-, Hip Add R/L 3-/3+ Goal status: IN PROGRESS  3.  Pt will increase by at least 23m (18ft) in order to demonstrate clinically significant improvement in cardiopulmonary endurance and community ambulation Baseline: 10/29/22: 860' Goal status: IN PROGRESS  4.  Pt will decrease 5TSTS by at least 3 seconds in order to demonstrate clinically significant improvement in LE strength Baseline: 10/29/22:  22.12 seconds with LUE support on arm rest Goal status: IN PROGRESS  5.  Patient will perform >=11 reps of sit to stands for 30 sec chair stands to demonstrate age and gender matched norms for LE endurance that places her at a decreased risk for falls.  Baseline: 8 reps  Goal status: IN PROGRESS  6.  Patient will demonstrate reduced falls risk as evidenced by Dynamic Gait Index (DGI) >19/24. Baseline: 10/29/22: 13/24 Goal status: IN PROGRESS   PLAN:  PT FREQUENCY: 1-2x/week  PT DURATION: 10 weeks  PLANNED INTERVENTIONS: Therapeutic exercises, Neuromuscular re-education, Balance training, Gait training, Self Care, Joint mobilization, Joint manipulation, Stair training, DME instructions, Aquatic Therapy, Dry Needling, Spinal manipulation, Spinal mobilization, Cryotherapy, Moist heat, Manual therapy, and Re-evaluation  PLAN FOR NEXT SESSION:  Continue to progress hip strengthening exercises while also incorporating parascapular and low back strengthening exercises: Good mornings/dead lifts.   Ellin Goodie PT, DPT  Physical Therapist- [facility]  11/20/2022, 10:34 AM

## 2022-11-22 ENCOUNTER — Ambulatory Visit: Payer: Medicare Other | Admitting: Physical Therapy

## 2022-11-27 ENCOUNTER — Ambulatory Visit: Payer: Medicare Other | Admitting: Physical Therapy

## 2022-11-27 ENCOUNTER — Telehealth: Payer: Self-pay | Admitting: Physical Therapy

## 2022-11-27 NOTE — Telephone Encounter (Signed)
Called pt to inquire about absence. Did not reach pt, but left message instructing pt to call back to reschedule.

## 2022-11-29 ENCOUNTER — Ambulatory Visit: Payer: Medicare Other

## 2022-11-29 ENCOUNTER — Encounter: Payer: Self-pay | Admitting: Physical Therapy

## 2022-11-29 DIAGNOSIS — R262 Difficulty in walking, not elsewhere classified: Secondary | ICD-10-CM | POA: Diagnosis not present

## 2022-11-29 DIAGNOSIS — M25551 Pain in right hip: Secondary | ICD-10-CM

## 2022-11-29 NOTE — Therapy (Addendum)
OUTPATIENT PHYSICAL THERAPY LOWER EXTREMITY TREATMENT   Patient Name: Nicole Jensen MRN: [id number] DOB:August 23, 1950, 72 y.o., female Today's Date: 11/29/2022  END OF SESSION:  PT End of Session - 11/29/22 0944     Visit Number 7    Number of Visits 20    Date for PT Re-Evaluation 01/01/23    Authorization Type Blue [employer]    Authorization Time Period Based on [place]    Authorization - Visit Number 7    Authorization - Number of Visits 20    Progress Note Due on Visit 10    PT Start Time [phone]    PT Stop Time 1028    PT Time Calculation (min) 42 min    Activity Tolerance Patient tolerated treatment well    Behavior During Therapy WFL for tasks assessed/performed               Past Medical History:  Diagnosis Date   Arthritis    Leg cramps    right leg   Past Surgical History:  Procedure Laterality Date   ABDOMINAL HYSTERECTOMY     partial. pt reports she has cervix   BARIATRIC SURGERY  2019   COLONOSCOPY N/A 03/07/2021   Procedure: COLONOSCOPY;  Surgeon: Toney Reil, MD;  Location: [facility] ENDOSCOPY;  Service: Gastroenterology;  Laterality: N/A;   COLONOSCOPY WITH PROPOFOL     Patient Active Problem List   Diagnosis Date Noted   Right hip pain 02/20/2022   History of anemia 02/20/2022   Prediabetes 11/02/2016   Allergic rhinitis 05/27/2015   Leiomyoma of uterus 05/27/2015   Hypercholesterolemia 05/27/2015   Anterior optic neuritis 05/27/2015   Avitaminosis D 05/27/2015    PCP: Dr. Shirlee Latch   REFERRING PROVIDER: Dr. Shirlee Latch   REFERRING DIAG: Right hip pain, right hip OA   THERAPY DIAG:  Pain in right hip  Difficulty in walking, not elsewhere classified  Rationale for Evaluation and Treatment: Rehabilitation  ONSET DATE: 10/22/2016   SUBJECTIVE:   SUBJECTIVE STATEMENT: Patient reports feeling better after drinking "BodyArmor" since last session and it has made a difference. Reports pain in feet.   PERTINENT HISTORY: Pt  reports that she had a recent fall after tripping over steps. However, she was referred for her right hip for severe OA. 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 She has been treating pain with cream and stopped taking tylenol extra strength. She had bariatric surgery in 2019 and she has been able to keep weight off since having surgery. 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  PAIN:  Are you having pain? No  PRECAUTIONS: None  WEIGHT BEARING RESTRICTIONS: No  FALLS:  Has patient fallen in last 6 months? Yes. 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   NEXT MD VISIT: Not sure   OBJECTIVE:   VITALS: BP 122/74 SpO2 100 HR 71   DIAGNOSTIC FINDINGS: CLINICAL DATA:  Right hip pain   EXAM: DG HIP (WITH OR WITHOUT PELVIS) 2-3V RIGHT   COMPARISON:  None Available.  FINDINGS: There is moderate-severe axial joint space narrowing of the right hip with osteophyte formation, subchondral sclerosis and cystic change. Moderate degenerative changes of the left hip. Moderate-severe degenerative changes of the symphysis pubis with subchondral sclerosis.   IMPRESSION: Moderate-severe osteoarthritis of the right hip.     Electronically Signed   By: Caprice Renshaw M.D.   On: 09/02/2022 10:23  PATIENT SURVEYS:  FOTO 66/100 with target 72   COGNITION: Overall cognitive status: Within functional limits for tasks assessed     SENSATION: WFL    MUSCLE LENGTH: Hamstrings: Right 90 deg; Left 70 deg Thomas test: Negative  bilateral   POSTURE: rounded shoulders and forward head  PALPATION: Right and left greater trochanter TTP but inside hip (c- sign)  LOWER EXTREMITY ROM:  Active ROM Right eval Left eval  Hip flexion    Hip extension    Hip abduction    Hip adduction    Hip internal rotation 10 16  Hip external rotation 20 23  Knee flexion 120* 120*  Knee extension    Ankle dorsiflexion    Ankle plantarflexion    Ankle inversion    Ankle eversion     (Blank rows = not tested)         LOWER EXTREMITY MMT:  MMT Right eval Left eval  Hip flexion 4 4  Hip extension 3 3-  Hip abduction 4- 4-  Hip adduction 3- 3+  Hip internal rotation    Hip external rotation    Knee flexion 4 4  Knee extension 4 4  Ankle dorsiflexion 4 4  Ankle plantarflexion    Ankle inversion    Ankle eversion     (Blank rows = not tested)    LOWER EXTREMITY SPECIAL TESTS:  Hip special tests: Luisa Hart (FABER) test: positive , Ely's test: positive , and Anterior hip impingement test: positive   FUNCTIONAL TESTS:  5 times sit to stand: NT 30 seconds chair stand test 6 minute walk test: NT 10 meter walk test: NT Dynamic Gait Index: NT   GAIT: Distance walked: 50 ft  Assistive device utilized: None Level of assistance: Complete Independence Comments: Forward flexed posture and decreased stance time and step length on right foot    TODAY'S TREATMENT:                                                                                                                              DATE:  11/29/22: Nustep seat and arms at 9 for 6 mins Sit to stand from 18 inch surface 2 x 10 with gripping 8lb dumbbell with both hands  Seated row with BTB 2 x 15  Standing marching 2 x 10  Standing hip abduction 2 x 10 Standing hip extension 2 x 10 Obstacle course (2 airex, 2 hurdles, half bolster, 3 hedgehogs, yoga block) 10 m x 4  Lateral step over 5 hurdles x 4 laps   11/20/22: Nu-Step seat and arms  at 9 for 5 min  Sit  to Stand from 18 inch surface 1 x 10  Sit to Stand from 18 inch surface with 8 lb water gallon 2 x 10  10 m- (3 x obstacle step over) x 4  10 m (5 x obstacle step over) x 4  -min VC to maintain constant speed  Straight Leg Raise 3 x 10  -Pt reports increased pain that radiates down right quad  Modified Thomas Stretch with PT providing overpressure 3 x 30 sec on RLE   11/15/22:  Nu-Step seat and arms at 9 for 5 min  SLR 3 x 10  -Pt instructed to bend right knee because of weakness, but eventually able to straighten right knee   Abdominal Crunches with crossed arms 3 x 10  Sit to Stand at 18 inch with no use of arms 2 x 10  Seated Hip ER stretch 4 x 30 sec  Seated HS Stretch 4 x 30 sec   11/08/22: Nu-Step seat and arms at 9 for 5 min  Seated Hip ER stretch 3 x 30 sec  -min VC to maintain forward lean and not to bounce  Seated HS Stretch 2 x 30 sec  Mini-Squat with BUE support 1 x 10 from 25 inch  Sit to Stand from 22 inch mat height 22 inch  -Pt shows slight posterior lean    PATIENT EDUCATION:  Education details: form and technique for correct performance of exercise  Person educated: Patient Education method: Explanation, Demonstration, Verbal cues, and Handouts Education comprehension: verbalized understanding, returned demonstration, and verbal cues required  HOME EXERCISE PROGRAM: Access Code: [email or web address] URL: [email or web address] Date: 11/20/2022 Prepared by: Ellin Goodie  Exercises - Seated Hamstring Stretch  - 1 x daily - 3 reps - 60 sec  hold - Modified Thomas Stretch  - 1 x daily - 3 reps - 30-60 sec  hold - Bound Angle Hands Forward   - 1 x daily - 3 reps - 60 sec  hold - Seated Hip External Rotation Stretch  - 1 x daily - 3 reps - 60 sec  hold - Supine Active Straight Leg Raise  - 3 x weekly - 3 sets - 10 reps - Curl Up with Arms Crossed  - 3 x weekly - 3 sets - 10 reps - Sit to Stand  - 3 x weekly - 3 sets - 10 reps  ASSESSMENT:  CLINICAL  IMPRESSION:  Patient continues to demonstrate decreased LE strength and balance deficits. Session focused on LE strengthening and dynamic balance activities. Demonstrates difficulty with sit to stand with no UE support but able to perform sit to stand with 8# weight in hands. Requires minA during balance activities on unlevel surfaces. Patient will continue to benefit from skilled therapy to address remaining deficits in order to improve quality of life and return to PLOF.     OBJECTIVE IMPAIRMENTS: Abnormal gait, difficulty walking, decreased ROM, decreased strength, impaired flexibility, obesity, and pain.   ACTIVITY LIMITATIONS: carrying, lifting, bending, standing, squatting, stairs, and locomotion level  PARTICIPATION LIMITATIONS: cleaning, shopping, community activity, occupation, and yard work  PERSONAL FACTORS: Age, Fitness, Profession, Time since onset of injury/illness/exacerbation, and 1 comorbidity: obesity  are also affecting patient's functional outcome.   REHAB POTENTIAL: Fair severity of osteoarthritis and chronicity of underlying MSK pathology   CLINICAL DECISION MAKING: Stable/uncomplicated  EVALUATION COMPLEXITY: Low   GOALS: Goals reviewed with patient? No  SHORT TERM GOALS: Target date: 11/06/2022  Pt will be independent  with HEP in order to improve strength and balance in order to decrease fall risk and improve function at home and work. Baseline: Unable to perform independently Goal status: IN PROGRESS  2.  Patient will develop a prophylactic pain management plan to better tolerate movement and activity without pain limiting her.  Baseline: Not taking any medications currently and she is limited by pain  Goal status: IN PROGRESS   LONG TERM GOALS: Target date: 01/01/2023  Patient will have improved function and activity level as evidenced by an increase in FOTO score by 10 points or more.  Baseline: 66/100 with target of 72  Goal status: IN PROGRESS  2.   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  Baseline: Hip Flex R/L 4/4, Hip Ext R/L 3/3-, Hip Abd R/L 4-/4-, Hip Add R/L 3-/3+ Goal status: IN PROGRESS  3.  Pt will increase by at least 55m (145ft) in order to demonstrate clinically significant improvement in cardiopulmonary endurance and community ambulation Baseline: 10/29/22: 860' Goal status: IN PROGRESS  4.  Pt will decrease 5TSTS by at least 3 seconds in order to demonstrate clinically significant improvement in LE strength Baseline: 10/29/22:  22.12 seconds with LUE support on arm rest Goal status: IN PROGRESS  5.  Patient will perform >=11 reps of sit to stands for 30 sec chair stands to demonstrate age and gender matched norms for LE endurance that places her at a decreased risk for falls.  Baseline: 8 reps  Goal status: IN PROGRESS  6.  Patient will demonstrate reduced falls risk as evidenced by Dynamic Gait Index (DGI) >19/24. Baseline: 10/29/22: 13/24 Goal status: IN PROGRESS   PLAN:  PT FREQUENCY: 1-2x/week  PT DURATION: 10 weeks  PLANNED INTERVENTIONS: Therapeutic exercises, Neuromuscular re-education, Balance training, Gait training, Self Care, Joint mobilization, Joint manipulation, Stair training, DME instructions, Aquatic Therapy, Dry Needling, Spinal manipulation, Spinal mobilization, Cryotherapy, Moist heat, Manual therapy, and Re-evaluation  PLAN FOR NEXT SESSION:  Continue to progress hip strengthening exercises while also incorporating parascapular and low back strengthening exercises: Good mornings/dead lifts.   Maylon Peppers PT, DPT  Physical Therapist- [facility]  11/29/2022, 9:45 AM

## 2022-12-04 ENCOUNTER — Ambulatory Visit: Payer: Medicare Other | Admitting: Physical Therapy

## 2022-12-04 ENCOUNTER — Ambulatory Visit: Payer: Medicare Other | Admitting: Podiatry

## 2022-12-04 ENCOUNTER — Encounter: Payer: Self-pay | Admitting: Physical Therapy

## 2022-12-04 DIAGNOSIS — R262 Difficulty in walking, not elsewhere classified: Secondary | ICD-10-CM

## 2022-12-04 DIAGNOSIS — M25551 Pain in right hip: Secondary | ICD-10-CM | POA: Diagnosis not present

## 2022-12-04 DIAGNOSIS — Q828 Other specified congenital malformations of skin: Secondary | ICD-10-CM | POA: Diagnosis not present

## 2022-12-04 NOTE — Therapy (Signed)
OUTPATIENT PHYSICAL THERAPY LOWER EXTREMITY TREATMENT   Patient Name: Nicole Jensen MRN: [id number] DOB:04-Dec-1950, 72 y.o., female Today's Date: 11/29/2022  END OF SESSION:  PT End of Session - 11/29/22 0944     Visit Number 7    Number of Visits 20    Date for PT Re-Evaluation 01/01/23    Authorization Type Blue Medicare    Authorization [place] Period Based on [place]    Authorization - Visit Number 7    Authorization - Number of Visits 20    Progress Note Due on Visit 10    PT Start Time [phone]    PT Stop Time 1028    PT Time Calculation (min) 42 min    Activity Tolerance Patient tolerated treatment well    Behavior During Therapy WFL for tasks assessed/performed               Past Medical History:  Diagnosis Date   Arthritis    Leg cramps    right leg   Past Surgical History:  Procedure Laterality Date   ABDOMINAL HYSTERECTOMY     partial. pt reports she has cervix   BARIATRIC SURGERY  2019   COLONOSCOPY N/A 03/07/2021   Procedure: COLONOSCOPY;  Surgeon: Toney Reil, MD;  Location: [facility] ENDOSCOPY;  Service: Gastroenterology;  Laterality: N/A;   COLONOSCOPY WITH PROPOFOL     Patient Active Problem List   Diagnosis Date Noted   Right hip pain 02/20/2022   History of anemia 02/20/2022   Prediabetes 11/02/2016   Allergic rhinitis 05/27/2015   Leiomyoma of uterus 05/27/2015   Hypercholesterolemia 05/27/2015   Anterior optic neuritis 05/27/2015   Avitaminosis D 05/27/2015    PCP: Dr. Shirlee Latch   REFERRING PROVIDER: Dr. Shirlee Latch   REFERRING DIAG: Right hip pain, right hip OA   THERAPY DIAG:  Pain in right hip  Difficulty in walking, not elsewhere classified  Rationale for Evaluation and Treatment: Rehabilitation  ONSET DATE: 10/22/2016   SUBJECTIVE:   SUBJECTIVE STATEMENT: Pt reports increased pain in the groin of her right hip especially when walking.   PERTINENT HISTORY: Pt reports that she had a recent fall after tripping  over steps. However, she was referred for her right hip for severe OA. 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 She has been treating pain with cream and stopped taking tylenol extra strength. She had bariatric surgery in 2019 and she has been able to keep weight off since having surgery. 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  PAIN:  Are you having pain? Yes: NPRS scale: 2-3/10 Pain location: Right groin  Pain description: Achy Aggravating factors: Walking Relieving factors: Not moving   PRECAUTIONS: None  WEIGHT BEARING RESTRICTIONS: No  FALLS:  Has patient fallen in last 6 months? Yes. 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   NEXT MD VISIT: Not sure   OBJECTIVE:   VITALS: BP 122/74 SpO2 100 HR 71   DIAGNOSTIC FINDINGS: CLINICAL DATA:  Right hip pain   EXAM: DG HIP (WITH  OR WITHOUT PELVIS) 2-3V RIGHT   COMPARISON:  None Available.   FINDINGS: There is moderate-severe axial joint space narrowing of the right hip with osteophyte formation, subchondral sclerosis and cystic change. Moderate degenerative changes of the left hip. Moderate-severe degenerative changes of the symphysis pubis with subchondral sclerosis.   IMPRESSION: Moderate-severe osteoarthritis of the right hip.     Electronically Signed   By: Caprice Renshaw M.D.   On: 09/02/2022 10:23  PATIENT SURVEYS:  FOTO 66/100 with target 72   COGNITION: Overall cognitive status: Within functional limits for tasks  assessed     SENSATION: WFL    MUSCLE LENGTH: Hamstrings: Right 90 deg; Left 70 deg Thomas test: Negative bilateral   POSTURE: rounded shoulders and forward head  PALPATION: Right and left greater trochanter TTP but inside hip (c- sign)  LOWER EXTREMITY ROM:  Active ROM Right eval Left eval  Hip flexion    Hip extension    Hip abduction    Hip adduction    Hip internal rotation 10 16  Hip external rotation 20 23  Knee flexion 120* 120*  Knee extension    Ankle dorsiflexion    Ankle plantarflexion    Ankle inversion    Ankle eversion     (Blank rows = not tested)         LOWER EXTREMITY MMT:  MMT Right eval Left eval  Hip flexion 4 4  Hip extension 3 3-  Hip abduction 4- 4-  Hip adduction 3- 3+  Hip internal rotation    Hip external rotation    Knee flexion 4 4  Knee extension 4 4  Ankle dorsiflexion 4 4  Ankle plantarflexion    Ankle inversion    Ankle eversion     (Blank rows = not tested)    LOWER EXTREMITY SPECIAL TESTS:  Hip special tests: Luisa Hart (FABER) test: positive , Ely's test: positive , and Anterior hip impingement test: positive   FUNCTIONAL TESTS:  5 times sit to stand: 22 sec with LUE support  30 seconds chair stand test 8 reps  6 minute walk test: 860 ft  Dynamic Gait Index: 13/24   GAIT: Distance walked: 50 ft  Assistive device utilized: None Level of assistance: Complete Independence Comments: Forward flexed posture and decreased stance time and step length on right foot    TODAY'S TREATMENT:                                                                                                                              DATE:   12/04/22: Nu-Step with seat and arms at 9 for 5 min  : 970 ft FOTO: 56 Hip ER Stretch 3 x 30 sec  Standing Hip Abduction with BUE support 3 x 10  -min VC to avoid trunk lateral lean  -Visual input with mirror to decrease compensation    11/29/22: Nustep seat and arms at 9 for 6  mins Sit to stand  from 18 inch surface 2 x 10 with gripping 8lb dumbbell with both hands  Seated row with BTB 2 x 15  Standing marching 2 x 10  Standing hip abduction 2 x 10 Standing hip extension 2 x 10 Obstacle course (2 airex, 2 hurdles, half bolster, 3 hedgehogs, yoga block) 10 m x 4  Lateral step over 5 hurdles x 4 laps   11/20/22: Nu-Step seat and arms at 9 for 5 min  Sit to Stand from 18 inch surface 1 x 10  Sit to Stand from 18 inch surface with 8 lb water gallon 2 x 10  10 m- (3 x obstacle step over) x 4  10 m (5 x obstacle step over) x 4  -min VC to maintain constant speed  Straight Leg Raise 3 x 10  -Pt reports increased pain that radiates down right quad  Modified Energy manager with PT providing overpressure 3 x 30 sec on RLE   PATIENT EDUCATION:  Education details: form and technique for correct performance of exercise  Person educated: Patient Education method: Explanation, Demonstration, Verbal cues, and Handouts Education comprehension: verbalized understanding, returned demonstration, and verbal cues required  HOME EXERCISE PROGRAM: Access Code: [email or web address] URL: [email or web address] Date: 12/04/2022 Prepared by: Ellin Goodie  Exercises - Seated Hamstring Stretch  - 1 x daily - 3 reps - 60 sec  hold - Modified Thomas Stretch  - 1 x daily - 3 reps - 30-60 sec  hold - Bound Angle Hands Forward   - 1 x daily - 3 reps - 60 sec  hold - Seated Hip External Rotation Stretch  - 1 x daily - 3 reps - 60 sec  hold - Supine Active Straight Leg Raise  - 3 x weekly - 3 sets - 10 reps - Curl Up with Arms Crossed  - 3 x weekly - 3 sets - 10 reps - Sit to Stand  - 3 x weekly - 3 sets - 10 reps - Standing Hip Abduction with Counter Support  - 3 x weekly - 3 sets - 10 reps  ASSESSMENT:  CLINICAL IMPRESSION: Pt shows improvement with aerobic endurance and activity tolerance with ability to cover more distance on . Despite aerobic endurance and activity  tolerance gains, pt continues to be limited by right hip pain with her daily tasks as evidenced by decrease in her FOTO score. She will continue to benefit from skilled therapy to address remaining deficits in order to improve quality of life and return to PLOF.    OBJECTIVE IMPAIRMENTS: Abnormal gait, difficulty walking, decreased ROM, decreased strength, impaired flexibility, obesity, and pain.   ACTIVITY LIMITATIONS: carrying, lifting, bending, standing, squatting, stairs, and locomotion level  PARTICIPATION LIMITATIONS: cleaning, shopping, community activity, occupation, and yard work  PERSONAL FACTORS: Age, Fitness, Profession, Time since onset of injury/illness/exacerbation, and 1 comorbidity: obesity  are also affecting patient's functional outcome.   REHAB POTENTIAL: Fair severity of osteoarthritis and chronicity of underlying MSK pathology   CLINICAL DECISION MAKING: Stable/uncomplicated  EVALUATION COMPLEXITY: Low   GOALS: Goals reviewed with patient? No  SHORT TERM GOALS: Target date: 11/06/2022  Pt will be independent with HEP in order to improve strength and balance in order to decrease fall risk and improve function at home and work. Baseline: Unable to perform independently 12/04/22: Able to perform independently  Goal status: MET  2.  Patient will develop a prophylactic pain management plan to better tolerate movement and activity without pain limiting her.  Baseline: Limited by pain  12/04/22: Not feeling as much pain and using tylenol  Goal status: MET   LONG TERM GOALS: Target date: 01/01/2023  Patient will have improved function and activity level as evidenced by an increase in FOTO score by 10 points or more.  Baseline: 66/100 with target of 72 12/04/22: 56 Goal status: IN PROGRESS  2.  Patient will improve hip MMT by 1/3 grade MMT (ie 4- to 4) to provide additional stability to low back to perform physical tasks for job cleaning rooms at school with less pain and  discomfort.  Baseline: Hip Flex R/L 4/4, Hip Ext R/L 3/3-, Hip Abd R/L 4-/4-, Hip Add R/L 3-/3+ Goal status: IN PROGRESS  3.  Pt will increase by at least 6m (156ft) in order to demonstrate clinically significant improvement in cardiopulmonary endurance and community ambulation Baseline: 10/29/22: 860'  12/04/22: 970' Goal status: IN PROGRESS  4.  Pt will decrease 5TSTS by at least 3 seconds in order to demonstrate clinically significant improvement in LE strength Baseline: 10/29/22:  22.12 seconds with LUE support on arm rest Goal status: IN PROGRESS  5.  Patient will perform >=11 reps of sit to stands for 30 sec chair stands to demonstrate age and gender matched norms for LE endurance that places her at a decreased risk for falls.  Baseline: 8 reps  Goal status: IN PROGRESS  6.  Patient will demonstrate reduced falls risk as evidenced by Dynamic Gait Index (DGI) >19/24. Baseline: 10/29/22: 13/24 Goal status: IN PROGRESS   PLAN:  PT FREQUENCY: 1-2x/week  PT DURATION: 10 weeks  PLANNED INTERVENTIONS: Therapeutic exercises, Neuromuscular re-education, Balance training, Gait training, Self Care, Joint mobilization, Joint manipulation, Stair training, DME instructions, Aquatic Therapy, Dry Needling, Spinal manipulation, Spinal mobilization, Cryotherapy, Moist heat, Manual therapy, and Re-evaluation  PLAN FOR NEXT SESSION:  DGI, 5x STS, and 30 sec chair stands. Look at foot position for shoe recs. Continue to progress hip strengthening exercises while also incorporating parascapular and low back strengthening exercises: Good mornings/dead lifts.   Ellin Goodie PT, DPT  11/29/2022, 9:45 AM

## 2022-12-04 NOTE — Progress Notes (Signed)
  Subjective:  Patient ID: Nicole Jensen, female    DOB: Dec 06, 1950,  MRN: [id number]  Chief Complaint  Patient presents with   Callouses    72 y.o. female presents with the above complaint.  Patient presents with right submetatarsal 5 porokeratotic lesion/benign skin lesion.  Patient states pain for touch is progressive gotten worse hurts with ambulation worse with pressure she has not seen anyone else for her to see me for this.  Denies any other acute complaints.  Would like to discuss treatment options for this.   Review of Systems: Negative except as noted in the HPI. Denies N/V/F/Ch.  Past Medical History:  Diagnosis Date   Arthritis    Leg cramps    right leg    Current Outpatient Medications:    aspirin 81 MG tablet, Take 81 mg by mouth daily. As needed, Disp: , Rfl:    fluticasone (FLONASE) 50 MCG/ACT nasal spray, Place 1 spray into both nostrils daily., Disp: 16 mL, Rfl: 3   meloxicam (MOBIC) 15 MG tablet, Take 1 tablet (15 mg total) by mouth daily., Disp: 30 tablet, Rfl: 0   rosuvastatin (CRESTOR) 5 MG tablet, Take 1 tablet (5 mg total) by mouth daily., Disp: 90 tablet, Rfl: 1  Social History   Tobacco Use  Smoking Status Never  Smokeless Tobacco Never    No Known Allergies Objective:  There were no vitals filed for this visit. There is no height or weight on file to calculate BMI. Constitutional Well developed. Well nourished.  Vascular Dorsalis pedis pulses palpable bilaterally. Posterior tibial pulses palpable bilaterally. Capillary refill normal to all digits.  No cyanosis or clubbing noted. Pedal hair growth normal.  Neurologic Normal speech. Oriented to person, place, and time. Epicritic sensation to light touch grossly present bilaterally.  Dermatologic Right submetatarsal 3 porokeratotic lesion with central nucleated core noted.  Pain on palpation to the lesion.  No pinpoint bleeding noted.  Orthopedic: Normal joint ROM without pain or crepitus  bilaterally. No visible deformities. No bony tenderness.   Radiographs: None Assessment:   No diagnosis found.  Plan:  Patient was evaluated and treated and all questions answered.  Right submetatarsal 3 porokeratosis -All questions and concerns were discussed with the patient in extensive detail -Using chisel blade to handle the lesion was debrided down to healthy striated tissue followed by excision of central nucleated core.  No pinpoint bleeding noted no complication noted. -I discussed shoe gear modification as well  No follow-ups on file.

## 2022-12-06 ENCOUNTER — Ambulatory Visit: Payer: Medicare Other | Admitting: Podiatry

## 2022-12-12 ENCOUNTER — Telehealth: Payer: Self-pay

## 2022-12-12 ENCOUNTER — Ambulatory Visit: Payer: Medicare Other | Admitting: Physical Therapy

## 2022-12-12 NOTE — Telephone Encounter (Signed)
Called patient to inquire about absence. Did not reach patient but left VM instructing patient to call back and about next scheduled appointment on 12/17/22 at 9:00am.

## 2022-12-17 ENCOUNTER — Encounter: Payer: Self-pay | Admitting: Physical Therapy

## 2022-12-17 ENCOUNTER — Ambulatory Visit: Payer: Medicare Other | Attending: Family Medicine

## 2022-12-17 DIAGNOSIS — M25551 Pain in right hip: Secondary | ICD-10-CM | POA: Diagnosis not present

## 2022-12-17 DIAGNOSIS — R262 Difficulty in walking, not elsewhere classified: Secondary | ICD-10-CM | POA: Diagnosis not present

## 2022-12-17 NOTE — Therapy (Signed)
OUTPATIENT PHYSICAL THERAPY LOWER EXTREMITY TREATMENT   Patient Name: Nicole Jensen MRN: [id number] DOB:March 27, 1951, 72 y.o., female Today's Date: 12/17/2022  END OF SESSION:  PT End of Session - 12/17/22 0857     Visit Number 9    Number of Visits 20    Date for PT Re-Evaluation 01/01/23    Authorization Type [employer]    Authorization Time Period Based on [place]    Authorization - Visit Number 9    Authorization - Number of Visits 20    Progress Note Due on Visit 10    PT Start Time 0900    PT Stop Time 0943    PT Time Calculation (min) 43 min    Activity Tolerance Patient tolerated treatment well    Behavior During Therapy WFL for tasks assessed/performed                Past Medical History:  Diagnosis Date   Arthritis    Leg cramps    right leg   Past Surgical History:  Procedure Laterality Date   ABDOMINAL HYSTERECTOMY     partial. pt reports she has cervix   BARIATRIC SURGERY  2019   COLONOSCOPY N/A 03/07/2021   Procedure: COLONOSCOPY;  Surgeon: Toney Reil, MD;  Location: [facility] ENDOSCOPY;  Service: Gastroenterology;  Laterality: N/A;   COLONOSCOPY WITH PROPOFOL     Patient Active Problem List   Diagnosis Date Noted   Right hip pain 02/20/2022   History of anemia 02/20/2022   Prediabetes 11/02/2016   Allergic rhinitis 05/27/2015   Leiomyoma of uterus 05/27/2015   Hypercholesterolemia 05/27/2015   Anterior optic neuritis 05/27/2015   Avitaminosis D 05/27/2015    PCP: Dr. Shirlee Latch   REFERRING PROVIDER: Dr. Shirlee Latch   REFERRING DIAG: Right hip pain, right hip OA   THERAPY DIAG:  Pain in right hip  Difficulty in walking, not elsewhere classified  Rationale for Evaluation and Treatment: Rehabilitation  ONSET DATE: 10/22/2016   SUBJECTIVE:   SUBJECTIVE STATEMENT: Patient reports feeling good this AM but some stiffness.    PERTINENT HISTORY: Pt reports that she had a recent fall after tripping over steps. However,  she was referred for her right hip for severe OA. 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 She has been treating pain with cream and stopped taking tylenol extra strength. She had bariatric surgery in 2019 and she has been able to keep weight off since having surgery. 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  PAIN:  Are you having pain? Yes: NPRS scale: 2-3/10 Pain location: Right groin  Pain description: Achy Aggravating factors: Walking Relieving factors: Not moving   PRECAUTIONS: None  WEIGHT BEARING RESTRICTIONS: No  FALLS:  Has patient fallen in last 6 months? Yes. 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   NEXT MD VISIT: Not sure   OBJECTIVE:   VITALS: BP 122/74 SpO2 100 HR 71   DIAGNOSTIC FINDINGS: CLINICAL DATA:  Right hip pain   EXAM: DG HIP (WITH OR WITHOUT PELVIS)  2-3V RIGHT   COMPARISON:  None Available.   FINDINGS: There is moderate-severe axial joint space narrowing of the right hip with osteophyte formation, subchondral sclerosis and cystic change. Moderate degenerative changes of the left hip. Moderate-severe degenerative changes of the symphysis pubis with subchondral sclerosis.   IMPRESSION: Moderate-severe osteoarthritis of the right hip.     Electronically Signed   By: Caprice Renshaw M.D.   On: 09/02/2022 10:23  PATIENT SURVEYS:  FOTO 66/100 with target 72   COGNITION: Overall cognitive status: Within functional limits for tasks assessed     SENSATION: WFL    MUSCLE  LENGTH: Hamstrings: Right 90 deg; Left 70 deg Thomas test: Negative bilateral   POSTURE: rounded shoulders and forward head  PALPATION: Right and left greater trochanter TTP but inside hip (c- sign)  LOWER EXTREMITY ROM:  Active ROM Right eval Left eval  Hip flexion    Hip extension    Hip abduction    Hip adduction    Hip internal rotation 10 16  Hip external rotation 20 23  Knee flexion 120* 120*  Knee extension    Ankle dorsiflexion    Ankle plantarflexion    Ankle inversion    Ankle eversion     (Blank rows = not tested)         LOWER EXTREMITY MMT:  MMT Right eval Left eval  Hip flexion 4 4  Hip extension 3 3-  Hip abduction 4- 4-  Hip adduction 3- 3+  Hip internal rotation    Hip external rotation    Knee flexion 4 4  Knee extension 4 4  Ankle dorsiflexion 4 4  Ankle plantarflexion    Ankle inversion    Ankle eversion     (Blank rows = not tested)    LOWER EXTREMITY SPECIAL TESTS:  Hip special tests: Luisa Hart (FABER) test: positive , Ely's test: positive , and Anterior hip impingement test: positive   FUNCTIONAL TESTS:  5 times sit to stand: 22 sec with LUE support  30 seconds chair stand test 8 reps  6 minute walk test: 860 ft  Dynamic Gait Index: 13/24   GAIT: Distance walked: 50 ft  Assistive device utilized: None Level of assistance: Complete Independence Comments: Forward flexed posture and decreased stance time and step length on right foot    TODAY'S TREATMENT:                                                                                                                              DATE:  12/12/22: Nu-Step with seat and arms at 9, level 3 resistance for 5 min  OMEGA seated row 15# 3 x 10, cues for core activation and limit shoulder shrug OMEGA lat pull down 15# 3 x 10  OMEGA paloff press 5# 2 x 10, each side   5xSTS: 18.06 sec 30 sec chair stand: 10 reps  DGI: 16/24  12/04/22: Nu-Step with seat and arms at 9  for 5 min   : 970 ft FOTO: 56 Hip ER Stretch 3 x 30 sec  Standing Hip Abduction with BUE support 3 x 10  -min VC to avoid trunk lateral lean  -Visual input with mirror to decrease compensation    11/29/22: Nustep seat and arms at 9 for 6 mins Sit to stand from 18 inch surface 2 x 10 with gripping 8lb dumbbell with both hands  Seated row with BTB 2 x 15  Standing marching 2 x 10  Standing hip abduction 2 x 10 Standing hip extension 2 x 10 Obstacle course (2 airex, 2 hurdles, half bolster, 3 hedgehogs, yoga block) 10 m x 4  Lateral step over 5 hurdles x 4 laps   11/20/22: Nu-Step seat and arms at 9 for 5 min  Sit to Stand from 18 inch surface 1 x 10  Sit to Stand from 18 inch surface with 8 lb water gallon 2 x 10  10 m- (3 x obstacle step over) x 4  10 m (5 x obstacle step over) x 4  -min VC to maintain constant speed  Straight Leg Raise 3 x 10  -Pt reports increased pain that radiates down right quad  Modified Energy manager with PT providing overpressure 3 x 30 sec on RLE   PATIENT EDUCATION:  Education details: form and technique for correct performance of exercise  Person educated: Patient Education method: Explanation, Demonstration, Verbal cues, and Handouts Education comprehension: verbalized understanding, returned demonstration, and verbal cues required  HOME EXERCISE PROGRAM: Access Code: [email or web address] URL: [email or web address] Date: 12/04/2022 Prepared by: Ellin Goodie  Exercises - Seated Hamstring Stretch  - 1 x daily - 3 reps - 60 sec  hold - Modified Thomas Stretch  - 1 x daily - 3 reps - 30-60 sec  hold - Bound Angle Hands Forward   - 1 x daily - 3 reps - 60 sec  hold - Seated Hip External Rotation Stretch  - 1 x daily - 3 reps - 60 sec  hold - Supine Active Straight Leg Raise  - 3 x weekly - 3 sets - 10 reps - Curl Up with Arms Crossed  - 3 x weekly - 3 sets - 10 reps - Sit to Stand  - 3 x weekly - 3 sets - 10 reps - Standing Hip Abduction with  Counter Support  - 3 x weekly - 3 sets - 10 reps  ASSESSMENT:  CLINICAL IMPRESSION:  Patient arrives to treatment session motivated and eager to participate. 5x STS, 30 second chair stand, and DGI were completed this session with patient demonstrating improvements on all tests. Continues to be considered high fall risk based on DGI results (16/24). After tests were completed, session focused on parascapular and core strengthening. Patient will continue to benefit from skilled therapy to address remaining deficits in order to improve quality of life and return to PLOF.    OBJECTIVE IMPAIRMENTS: Abnormal gait, difficulty walking, decreased ROM, decreased strength, impaired flexibility, obesity, and pain.   ACTIVITY LIMITATIONS: carrying, lifting, bending, standing, squatting, stairs, and locomotion level  PARTICIPATION LIMITATIONS: cleaning, shopping, community activity, occupation, and yard work  PERSONAL FACTORS: Age, Fitness, Profession, Time since onset of injury/illness/exacerbation, and 1 comorbidity: obesity  are also affecting patient's functional outcome.   REHAB POTENTIAL: Fair severity of osteoarthritis and chronicity of underlying MSK pathology   CLINICAL DECISION MAKING: Stable/uncomplicated  EVALUATION COMPLEXITY: Low   GOALS: Goals reviewed with patient? No  SHORT TERM GOALS: Target date: 11/06/2022  Pt will be independent with HEP in order to improve strength and balance in order to decrease fall risk and improve function at home and work. Baseline: Unable to perform independently 12/04/22: Able to perform independently  Goal status: MET  2.  Patient will develop a prophylactic pain management plan to better tolerate movement and activity without pain limiting her.  Baseline: Limited by pain 12/04/22: Not feeling as much pain and using tylenol  Goal status: MET   LONG TERM GOALS: Target date: 01/01/2023  Patient will have improved function and activity level as  evidenced by an increase in FOTO score by 10 points or more.  Baseline: 66/100 with target of 72 12/04/22: 56 Goal status: IN PROGRESS  2.  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  Baseline: Hip Flex R/L 4/4, Hip Ext R/L 3/3-, Hip Abd R/L 4-/4-, Hip Add R/L 3-/3+ Goal status: IN PROGRESS  3.  Pt will increase by at least 21m (141ft) in order to demonstrate clinically significant improvement in cardiopulmonary endurance and community ambulation Baseline: 10/29/22: 860'  12/04/22: 970' Goal status: IN PROGRESS  4.  Pt will decrease 5TSTS by at least 5 seconds in order to demonstrate clinically significant improvement in LE strength Baseline: 10/29/22:  22.12 seconds with LUE support on arm rest; 12/17/22: 18.06 seconds with intermittent UE support  Goal status: IN PROGRESS  5.  Patient will perform >=11 reps of sit to stands for 30 sec chair stands to demonstrate age and gender matched norms for LE endurance that places her at a decreased risk for falls.  Baseline: 10/29/22: 8 reps; 12/17/22: 10 reps  Goal status: IN PROGRESS  6.  Patient will demonstrate reduced falls risk as evidenced by Dynamic Gait Index (DGI) >19/24. Baseline: 10/29/22: 13/24; 12/17/22: 16/24 Goal status: IN PROGRESS   PLAN:  PT FREQUENCY: 1-2x/week  PT DURATION: 10 weeks  PLANNED INTERVENTIONS: Therapeutic exercises, Neuromuscular re-education, Balance training, Gait training, Self Care, Joint mobilization, Joint manipulation, Stair training, DME instructions, Aquatic Therapy, Dry Needling, Spinal manipulation, Spinal mobilization, Cryotherapy, Moist heat, Manual therapy, and Re-evaluation  PLAN FOR NEXT SESSION: Look at foot position for shoe recs. Continue to progress hip strengthening exercises while also incorporating parascapular and low back strengthening  exercises: Good mornings/dead lifts.    Maylon Peppers, PT, DPT Physical Therapist - [facility]  12/17/2022, 12:23 PM

## 2022-12-19 ENCOUNTER — Ambulatory Visit: Payer: Medicare Other | Admitting: Physical Therapy

## 2022-12-24 ENCOUNTER — Ambulatory Visit: Payer: Medicare Other | Admitting: Physical Therapy

## 2022-12-24 DIAGNOSIS — R262 Difficulty in walking, not elsewhere classified: Secondary | ICD-10-CM

## 2022-12-24 DIAGNOSIS — M25551 Pain in right hip: Secondary | ICD-10-CM

## 2022-12-24 NOTE — Therapy (Addendum)
OUTPATIENT PHYSICAL THERAPY LOWER EXTREMITY PROGRESS NOTE/DISCHARGE    Patient Name: Nicole Jensen MRN: [id number] DOB:1951-02-14, 72 y.o., female Today's Date: 12/24/2022  END OF SESSION:  PT End of Session - 12/24/22 1515     Visit Number 10    Number of Visits 20    Date for PT Re-Evaluation 01/01/23    Authorization Type [employer]    Authorization Time Period Based on [place]    Authorization - Visit Number 10    Authorization - Number of Visits 20    Progress Note Due on Visit 10    PT Start Time 1515    PT Stop Time 1600    PT Time Calculation (min) 45 min    Activity Tolerance Patient tolerated treatment well    Behavior During Therapy WFL for tasks assessed/performed                Past Medical History:  Diagnosis Date   Arthritis    Leg cramps    right leg   Past Surgical History:  Procedure Laterality Date   ABDOMINAL HYSTERECTOMY     partial. pt reports she has cervix   BARIATRIC SURGERY  2019   COLONOSCOPY N/A 03/07/2021   Procedure: COLONOSCOPY;  Surgeon: Toney Reil, MD;  Location: [facility] ENDOSCOPY;  Service: Gastroenterology;  Laterality: N/A;   COLONOSCOPY WITH PROPOFOL     Patient Active Problem List   Diagnosis Date Noted   Right hip pain 02/20/2022   History of anemia 02/20/2022   Prediabetes 11/02/2016   Allergic rhinitis 05/27/2015   Leiomyoma of uterus 05/27/2015   Hypercholesterolemia 05/27/2015   Anterior optic neuritis 05/27/2015   Avitaminosis D 05/27/2015    PCP: Dr. Shirlee Latch   REFERRING PROVIDER: Dr. Shirlee Latch   REFERRING DIAG: Right hip pain, right hip OA   THERAPY DIAG:  Pain in right hip  Difficulty in walking, not elsewhere classified  Rationale for Evaluation and Treatment: Rehabilitation  ONSET DATE: 10/22/2016   SUBJECTIVE:   SUBJECTIVE STATEMENT: She reports feeling pain in her right hip off and on.    PERTINENT HISTORY: Pt reports that she had a recent fall after tripping over  steps. However, she was referred for her right hip for severe OA. 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 She has been treating pain with cream and stopped taking tylenol extra strength. She had bariatric surgery in 2019 and she has been able to keep weight off since having surgery. 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  PAIN:  Are you having pain? Yes: NPRS scale: 2-3/10 Pain location: Right groin  Pain description: Achy Aggravating factors: Walking Relieving factors: Not moving   PRECAUTIONS: None  WEIGHT BEARING RESTRICTIONS: No  FALLS:  Has patient fallen in last 6 months? Yes. 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   NEXT MD VISIT: Not sure   OBJECTIVE:   VITALS: BP 122/74 SpO2 100 HR 71   DIAGNOSTIC FINDINGS: CLINICAL DATA:  Right hip pain   EXAM: DG HIP (  WITH OR WITHOUT PELVIS) 2-3V RIGHT   COMPARISON:  None Available.   FINDINGS: There is moderate-severe axial joint space narrowing of the right hip with osteophyte formation, subchondral sclerosis and cystic change. Moderate degenerative changes of the left hip. Moderate-severe degenerative changes of the symphysis pubis with subchondral sclerosis.   IMPRESSION: Moderate-severe osteoarthritis of the right hip.     Electronically Signed   By: Caprice Renshaw M.D.   On: 09/02/2022 10:23  PATIENT SURVEYS:  FOTO 66/100 with target 72     12/24/22: 69  COGNITION: Overall cognitive status: Within functional limits for tasks  assessed     SENSATION: WFL    MUSCLE LENGTH: Hamstrings: Right 90 deg; Left 70 deg Thomas test: Negative bilateral   POSTURE: rounded shoulders and forward head  PALPATION: Right and left greater trochanter TTP but inside hip (c- sign)  LOWER EXTREMITY ROM:  Active ROM Right eval Left eval  Hip flexion    Hip extension    Hip abduction    Hip adduction    Hip internal rotation 10 16  Hip external rotation 20 23  Knee flexion 120* 120*  Knee extension    Ankle dorsiflexion    Ankle plantarflexion    Ankle inversion    Ankle eversion     (Blank rows = not tested)         LOWER EXTREMITY MMT:  MMT Right eval Left eval  Hip flexion 4 4  Hip extension 3 3-  Hip abduction 4- 4-  Hip adduction 3- 3+  Hip internal rotation    Hip external rotation    Knee flexion 4 4  Knee extension 4 4  Ankle dorsiflexion 4 4  Ankle plantarflexion    Ankle inversion    Ankle eversion     (Blank rows = not tested)    LOWER EXTREMITY SPECIAL TESTS:  Hip special tests: Luisa Hart (FABER) test: positive , Ely's test: positive , and Anterior hip impingement test: positive   FUNCTIONAL TESTS:  5 times sit to stand: 22 sec with LUE support  30 seconds chair stand test 8 reps  6 minute walk test: 860 ft  Dynamic Gait Index: 13/24   GAIT: Distance walked: 50 ft  Assistive device utilized: None Level of assistance: Complete Independence Comments: Forward flexed posture and decreased stance time and step length on right foot    TODAY'S TREATMENT:                                                                                                                              DATE:   12/24/22: Nu-Step with seat and arms at 7 level 3 resistance for 5 min  : 1000 ft  Hip MMT: Hip Flex R/L 4+/4+, Hip Abd R/L 4/4  FOTO: 69 DGI: 21/24  -Mild Impairments: Obstacles and Steps and Change in Gait speed   Reviewed HEP along with progressions.   12/12/22: Nu-Step with seat  and arms  at 9, level 3 resistance for 5 min  OMEGA seated row 15# 3 x 10, cues for core activation and limit shoulder shrug OMEGA lat pull down 15# 3 x 10  OMEGA paloff press 5# 2 x 10, each side   5xSTS: 18.06 sec 30 sec chair stand: 10 reps  DGI: 16/24  12/04/22: Nu-Step with seat and arms at 9 for 5 min  : 970 ft FOTO: 56 Hip ER Stretch 3 x 30 sec  Standing Hip Abduction with BUE support 3 x 10  -min VC to avoid trunk lateral lean  -Visual input with mirror to decrease compensation    11/29/22: Nustep seat and arms at 9 for 6 mins Sit to stand from 18 inch surface 2 x 10 with gripping 8lb dumbbell with both hands  Seated row with BTB 2 x 15  Standing marching 2 x 10  Standing hip abduction 2 x 10 Standing hip extension 2 x 10 Obstacle course (2 airex, 2 hurdles, half bolster, 3 hedgehogs, yoga block) 10 m x 4  Lateral step over 5 hurdles x 4 laps   11/20/22: Nu-Step seat and arms at 9 for 5 min  Sit to Stand from 18 inch surface 1 x 10  Sit to Stand from 18 inch surface with 8 lb water gallon 2 x 10  10 m- (3 x obstacle step over) x 4  10 m (5 x obstacle step over) x 4  -min VC to maintain constant speed  Straight Leg Raise 3 x 10  -Pt reports increased pain that radiates down right quad  Modified Energy manager with PT providing overpressure 3 x 30 sec on RLE   PATIENT EDUCATION:  Education details: form and technique for correct performance of exercise  Person educated: Patient Education method: Explanation, Demonstration, Verbal cues, and Handouts Education comprehension: verbalized understanding, returned demonstration, and verbal cues required  HOME EXERCISE PROGRAM: Access Code: [email or web address] URL: [email or web address] Date: 12/04/2022 Prepared by: Ellin Goodie  Exercises - Seated Hamstring Stretch  - 1 x daily - 3 reps - 60 sec  hold - Modified Thomas Stretch  - 1 x daily - 3 reps - 30-60 sec  hold - Bound Angle Hands Forward   - 1 x daily -  3 reps - 60 sec  hold - Seated Hip External Rotation Stretch  - 1 x daily - 3 reps - 60 sec  hold - Supine Active Straight Leg Raise  - 3 x weekly - 3 sets - 10 reps - Curl Up with Arms Crossed  - 3 x weekly - 3 sets - 10 reps - Sit to Stand  - 3 x weekly - 3 sets - 10 reps - Standing Hip Abduction with Counter Support  - 3 x weekly - 3 sets - 10 reps  ASSESSMENT:  CLINICAL IMPRESSION:  Pt has met nearly all her rehab goals with an improvement in LE strength and endurance along with dynamic balance and self perception of hip function. She has been educated on HEP and progressions to continue to maintain functional gains to decrease risk of falling and her ability to perform job related tasks. Patient is now ready for discharge.   OBJECTIVE IMPAIRMENTS: Abnormal gait, difficulty walking, decreased ROM, decreased strength, impaired flexibility, obesity, and pain.   ACTIVITY LIMITATIONS: carrying, lifting, bending, standing, squatting, stairs, and locomotion level  PARTICIPATION LIMITATIONS: cleaning, shopping, community activity, occupation, and yard work  PERSONAL FACTORS: Age, Fitness, Profession, Time since onset of injury/illness/exacerbation, and 1 comorbidity:  obesity  are also affecting patient's functional outcome.   REHAB POTENTIAL: Fair severity of osteoarthritis and chronicity of underlying MSK pathology   CLINICAL DECISION MAKING: Stable/uncomplicated  EVALUATION COMPLEXITY: Low   GOALS: Goals reviewed with patient? No  SHORT TERM GOALS: Target date: 11/06/2022  Pt will be independent with HEP in order to improve strength and balance in order to decrease fall risk and improve function at home and work. Baseline: Unable to perform independently 12/04/22: Able to perform independently  Goal status: MET  2.  Patient will develop a prophylactic pain management plan to better tolerate movement and activity without pain limiting her.  Baseline: Limited by pain 12/04/22: Not  feeling as much pain and using tylenol  Goal status: MET   LONG TERM GOALS: Target date: 01/01/2023  Patient will have improved function and activity level as evidenced by an increase in FOTO score by 10 points or more.  Baseline: 66/100 with target of 72 12/04/22: 56  12/24/22: 69 Goal status: Partially Met   2.  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  Baseline: Hip Flex R/L 4/4, Hip Ext R/L 3/3-, Hip Abd R/L 4-/4-, Hip Add R/L 3-/3+   Goal status: MET  3.  Pt will increase by at least 75m (118ft) in order to demonstrate clinically significant improvement in cardiopulmonary endurance and community ambulation Baseline: 10/29/22: 860'  12/04/22: 970'  12/24/22:1000' (24 ft from goal) Goal status: Partially Met   4.  Pt will decrease 5TSTS by at least 5 seconds in order to demonstrate clinically significant improvement in LE strength Baseline: 10/29/22:  22.12 seconds with LUE support on arm rest; 12/17/22: 18.06 sec with intermittent UE support, 12/24/22: 12.61 sec   Goal status: MET  5.  Patient will perform >=11 reps of sit to stands for 30 sec chair stands to demonstrate age and gender matched norms for LE endurance that places her at a decreased risk for falls.  Baseline: 10/29/22: 8 reps; 12/17/22: 10 reps 12/24/22: 11 reps  Goal status: MET  6.  Patient will demonstrate reduced falls risk as evidenced by Dynamic Gait Index (DGI) >19/24. Baseline: 10/29/22: 13/24; 12/17/22: 16/24 Goal status: IN PROGRESS   PLAN:  PT FREQUENCY: 1-2x/week  PT DURATION: 10 weeks  PLANNED INTERVENTIONS: Therapeutic exercises, Neuromuscular re-education, Balance training, Gait training, Self Care, Joint mobilization, Joint manipulation, Stair training, DME instructions, Aquatic Therapy, Dry Needling, Spinal manipulation, Spinal mobilization, Cryotherapy,  Moist heat, Manual therapy, and Re-evaluation  PLAN FOR NEXT SESSION: Discharge from PT    Ellin Goodie PT, DPT  Physical Therapist - [facility]  12/24/2022, 3:19 PM

## 2022-12-26 ENCOUNTER — Ambulatory Visit: Payer: Medicare Other | Admitting: Physical Therapy

## 2023-01-02 ENCOUNTER — Encounter: Payer: Medicare Other | Admitting: Physical Therapy

## 2023-01-07 ENCOUNTER — Encounter: Payer: Medicare Other | Admitting: Physical Therapy

## 2023-01-09 ENCOUNTER — Encounter: Payer: Medicare Other | Admitting: Physical Therapy

## 2023-02-01 ENCOUNTER — Encounter: Payer: Self-pay | Admitting: Family Medicine

## 2023-02-01 ENCOUNTER — Ambulatory Visit (INDEPENDENT_AMBULATORY_CARE_PROVIDER_SITE_OTHER): Payer: Medicare Other | Admitting: Family Medicine

## 2023-02-01 VITALS — BP 114/66 | HR 74 | Temp 97.0°F | Ht 63.0 in | Wt 167.2 lb

## 2023-02-01 DIAGNOSIS — E7800 Pure hypercholesterolemia, unspecified: Secondary | ICD-10-CM

## 2023-02-01 DIAGNOSIS — R634 Abnormal weight loss: Secondary | ICD-10-CM | POA: Diagnosis not present

## 2023-02-01 DIAGNOSIS — R7303 Prediabetes: Secondary | ICD-10-CM

## 2023-02-01 DIAGNOSIS — Z862 Personal history of diseases of the blood and blood-forming organs and certain disorders involving the immune mechanism: Secondary | ICD-10-CM

## 2023-02-01 DIAGNOSIS — K2100 Gastro-esophageal reflux disease with esophagitis, without bleeding: Secondary | ICD-10-CM | POA: Diagnosis not present

## 2023-02-01 DIAGNOSIS — J324 Chronic pansinusitis: Secondary | ICD-10-CM | POA: Insufficient documentation

## 2023-02-01 MED ORDER — OMEPRAZOLE 20 MG PO CPDR: 20.0000 mg | DELAYED_RELEASE_CAPSULE | Freq: Every day | ORAL | 3 refills | Status: DC

## 2023-02-01 MED ORDER — AZITHROMYCIN 500 MG PO TABS: 500.0000 mg | ORAL_TABLET | Freq: Every day | ORAL | 0 refills | Status: DC

## 2023-02-01 NOTE — Assessment & Plan Note (Signed)
13# down in 5 months and 27# down in 11 months Body mass index is 29.62 kg/m. Recommend labs

## 2023-02-01 NOTE — Assessment & Plan Note (Signed)
Chronic; stable Repeat A1c with ongoing acute symptoms and weight loss Continue to recommend balanced, lower carb meals. Smaller meal size, adding snacks. Choosing water as drink of choice and increasing purposeful exercise.

## 2023-02-01 NOTE — Assessment & Plan Note (Addendum)
Recommend ABX given ongoing symptoms; using OTC to support but continues to have ongoing green sputum from nose and mouth with associated weight loss Denies travel out of country Denies sick contacts; wears a mask at work

## 2023-02-01 NOTE — Assessment & Plan Note (Signed)
Repeat CBC given hx of weight loss and fatigue; however, pt continues to work 2 jobs out of the home

## 2023-02-01 NOTE — Assessment & Plan Note (Signed)
Repeat LP The 10-year ASCVD risk score (Arnett DK, et al., 2019) is: 9.8% I continue to recommend diet low in saturated fat and regular exercise - 30 min at least 5 times per week

## 2023-02-01 NOTE — Assessment & Plan Note (Signed)
Presumed; chronic, self limiting; seen in setting of ongoing cough with sinusitis Start low dose PPI to assist Continue to monitor ongoing need with PCP

## 2023-02-01 NOTE — Progress Notes (Signed)
Established patient visit   Patient: Nicole Jensen   DOB: 03/13/1951   72 y.o. Female  MRN: [id number] Visit Date: 02/01/2023  Today's healthcare provider: Jacky Kindle, FNP  Introduced to nurse practitioner role and practice setting.  All questions answered.  Discussed provider/patient relationship and expectations.  Chief Complaint  Patient presents with   Acute Visit    Going for about 3 weeks ,  some coughing, nasal congestion, no fever , using nasal spays and saline , otc  cold and cough medication    Subjective    HPI HPI     Acute Visit    Additional comments: Going for about 3 weeks ,  some coughing, nasal congestion, no fever , using nasal spays and saline , otc  cold and cough medication       Last edited by Annabell Sabal, CMA on 02/01/2023  3:02 PM.     Medications: Outpatient Medications Prior to Visit  Medication Sig   aspirin 81 MG tablet Take 81 mg by mouth daily. As needed   Dextromethorphan-guaiFENesin (MUCINEX DM PO) Take by mouth.   fluticasone (FLONASE) 50 MCG/ACT nasal spray Place 1 spray into both nostrils daily.   sodium chloride (OCEAN) 0.65 % SOLN nasal spray Place 1 spray into both nostrils as needed for congestion.   meloxicam (MOBIC) 15 MG tablet Take 1 tablet (15 mg total) by mouth daily. (Patient not taking: Reported on 02/01/2023)   rosuvastatin (CRESTOR) 5 MG tablet Take 1 tablet (5 mg total) by mouth daily. (Patient not taking: Reported on 02/01/2023)   No facility-administered medications prior to visit.    Review of Systems    Objective    BP 114/66   Pulse 74   Temp (!) 97 F (36.1 C)   Ht 5\' 3"  (1.6 m)   Wt 167 lb 3.2 oz (75.8 kg)   LMP  (LMP Unknown) Comment: partial hysterectomy  SpO2 100%   BMI 29.62 kg/m   Physical Exam Vitals and nursing note reviewed.  Constitutional:      General: She is not in acute distress.    Appearance: Normal appearance. She is overweight. She is not ill-appearing, toxic-appearing  or diaphoretic.  HENT:     Head: Normocephalic and atraumatic.  Cardiovascular:     Rate and Rhythm: Normal rate and regular rhythm.     Pulses: Normal pulses.     Heart sounds: Normal heart sounds. No murmur heard.    No friction rub. No gallop.  Pulmonary:     Effort: Pulmonary effort is normal. No respiratory distress.     Breath sounds: Normal breath sounds. No stridor. No wheezing, rhonchi or rales.  Chest:     Chest wall: No tenderness.  Abdominal:     General: Bowel sounds are normal.     Palpations: Abdomen is soft.  Musculoskeletal:        General: No swelling, tenderness, deformity or signs of injury. Normal range of motion.     Right lower leg: No edema.     Left lower leg: No edema.  Skin:    General: Skin is warm and dry.     Capillary Refill: Capillary refill takes less than 2 seconds.     Coloration: Skin is not jaundiced or pale.     Findings: No bruising, erythema, lesion or rash.  Neurological:     General: No focal deficit present.     Mental Status: She is alert and oriented to  person, place, and time. Mental status is at baseline.     Cranial Nerves: No cranial nerve deficit.     Sensory: No sensory deficit.     Motor: No weakness.     Coordination: Coordination normal.  Psychiatric:        Mood and Affect: Mood normal.        Behavior: Behavior normal.        Thought Content: Thought content normal.        Judgment: Judgment normal.      No results found for any visits on 02/01/23.  Assessment & Plan     Problem List Items Addressed This Visit       Respiratory   Chronic pansinusitis - Primary    Recommend ABX given ongoing symptoms; using OTC to support but continues to have ongoing green sputum from nose and mouth with associated weight loss Denies travel out of country Denies sick contacts; wears a mask at work       Relevant Medications   Dextromethorphan-guaiFENesin (MUCINEX DM PO)   sodium chloride (OCEAN) 0.65 % SOLN nasal spray    azithromycin (ZITHROMAX) 500 MG tablet     Digestive   Gastroesophageal reflux disease with esophagitis without hemorrhage    Presumed; chronic, self limiting; seen in setting of ongoing cough with sinusitis Start low dose PPI to assist Continue to monitor ongoing need with PCP      Relevant Medications   omeprazole (PRILOSEC) 20 MG capsule     Other   History of anemia    Repeat CBC given hx of weight loss and fatigue; however, pt continues to work 2 jobs out of the home       Hypercholesterolemia    Repeat LP The 10-year ASCVD risk score (Arnett DK, et al., 2019) is: 9.8% I continue to recommend diet low in saturated fat and regular exercise - 30 min at least 5 times per week       Relevant Orders   Lipid panel   Prediabetes    Chronic; stable Repeat A1c with ongoing acute symptoms and weight loss Continue to recommend balanced, lower carb meals. Smaller meal size, adding snacks. Choosing water as drink of choice and increasing purposeful exercise.       Relevant Orders   Hemoglobin A1c   Unexplained weight loss    13# down in 5 months and 27# down in 11 months Body mass index is 29.62 kg/m. Recommend labs      Relevant Orders   CBC with Differential/Platelet   Comprehensive Metabolic Panel (CMET)   TSH   Return if symptoms worsen or fail to improve.     Leilani Merl, FNP, have reviewed all documentation for this visit. The documentation on 02/01/23 for the exam, diagnosis, procedures, and orders are all accurate and complete.  Jacky Kindle, FNP  [facility] [phone] (phone) [phone] (fax)  [facility]

## 2023-02-02 LAB — LIPID PANEL
Chol/HDL Ratio: 3 ratio (ref 0.0–4.4)
Cholesterol, Total: 200 mg/dL — ABNORMAL HIGH (ref 100–199)
HDL: 67 mg/dL (ref >39)
LDL Chol Calc (NIH): 120 mg/dL — ABNORMAL HIGH (ref 0–99)
Triglycerides: 70 mg/dL (ref 0–149)
VLDL Cholesterol Cal: 13 mg/dL (ref 5–40)

## 2023-02-02 LAB — CBC WITH DIFFERENTIAL/PLATELET
Basophils Absolute: 0 10*3/uL (ref 0.0–0.2)
Basos: 1 %
EOS (ABSOLUTE): 0.1 10*3/uL (ref 0.0–0.4)
Eos: 2 %
Hematocrit: 34.6 % (ref 34.0–46.6)
Hemoglobin: 11.1 g/dL (ref 11.1–15.9)
Immature Grans (Abs): 0 10*3/uL (ref 0.0–0.1)
Immature Granulocytes: 0 %
Lymphocytes Absolute: 1.7 10*3/uL (ref 0.7–3.1)
Lymphs: 38 %
MCH: 26.6 pg (ref 26.6–33.0)
MCHC: 32.1 g/dL (ref 31.5–35.7)
MCV: 83 fL (ref 79–97)
Monocytes Absolute: 0.4 10*3/uL (ref 0.1–0.9)
Monocytes: 9 %
Neutrophils Absolute: 2.2 10*3/uL (ref 1.4–7.0)
Neutrophils: 50 %
Platelets: 301 10*3/uL (ref 150–450)
RBC: 4.17 x10E6/uL (ref 3.77–5.28)
RDW: 13.9 % (ref 11.7–15.4)
WBC: 4.4 10*3/uL (ref 3.4–10.8)

## 2023-02-02 LAB — COMPREHENSIVE METABOLIC PANEL
ALT: 20 IU/L (ref 0–32)
AST: 26 IU/L (ref 0–40)
Albumin: 3.7 g/dL — ABNORMAL LOW (ref 3.9–4.9)
Alkaline Phosphatase: 97 IU/L (ref 44–121)
BUN/Creatinine Ratio: 13 (ref 12–28)
BUN: 13 mg/dL (ref 8–27)
Bilirubin Total: 0.3 mg/dL (ref 0.0–1.2)
CO2: 26 mmol/L (ref 20–29)
Calcium: 9.9 mg/dL (ref 8.7–10.3)
Chloride: 104 mmol/L (ref 96–106)
Creatinine, Ser: 0.99 mg/dL (ref 0.57–1.00)
Globulin, Total: 3.3 g/dL (ref 1.5–4.5)
Glucose: 92 mg/dL (ref 70–99)
Potassium: 4.6 mmol/L (ref 3.5–5.2)
Sodium: 140 mmol/L (ref 134–144)
Total Protein: 7 g/dL (ref 6.0–8.5)
eGFR: 61 mL/min/{1.73_m2} (ref >59)

## 2023-02-02 LAB — HEMOGLOBIN A1C
Est. average glucose Bld gHb Est-mCnc: 123 mg/dL
Hgb A1c MFr Bld: 5.9 % — ABNORMAL HIGH (ref 4.8–5.6)

## 2023-02-02 LAB — TSH: TSH: 1.79 u[IU]/mL (ref 0.450–4.500)

## 2023-02-02 NOTE — Progress Notes (Signed)
Cholesterol remains stable; elevated. Recommend restart of Crestor 5 mg as directed by PCP to lower risk of heart attack and/or stroke. The 10-year ASCVD risk score (Arnett DK, et al., 2019) is: 9.6% Pre-diabetes remains stable. Continue to recommend balanced, lower carb meals. Smaller meal size, adding snacks. Choosing water as drink of choice and increasing purposeful exercise. Low levels of protein noted; given appetite changes continue to recommend use of once/daily protein supplement like Ensure to assist. Thyroid and anemia panels stable despite unplanned weight loss

## 2023-02-07 ENCOUNTER — Telehealth: Payer: Self-pay | Admitting: *Deleted

## 2023-02-07 NOTE — Telephone Encounter (Signed)
Pt given lab results per notes of E. Suzie Portela, FNP from 02/02/23 on 02/06/23. Pt verbalized understanding.  Cholesterol remains stable; elevated. Recommend restart of Crestor 5 mg as directed by PCP to lower risk of heart attack and/or stroke. The 10-year ASCVD risk score (Arnett DK, et al., 2019) is: 9.6% Pre-diabetes remains stable. Continue to recommend balanced, lower carb meals. Smaller meal size, adding snacks. Choosing water as drink of choice and increasing purposeful exercise. Low levels of protein noted; given appetite changes continue to recommend use of once/daily protein supplement like Ensure to assist. Thyroid and anemia panels stable despite unplanned weight loss     Patient reports she will restart crestor 5 mg but really does not want to . Will try the ensure supplements for protein.

## 2023-02-08 ENCOUNTER — Other Ambulatory Visit: Payer: Self-pay | Admitting: Family Medicine

## 2023-02-08 DIAGNOSIS — E7800 Pure hypercholesterolemia, unspecified: Secondary | ICD-10-CM

## 2023-02-08 MED ORDER — ROSUVASTATIN CALCIUM 5 MG PO TABS: 5.0000 mg | ORAL_TABLET | Freq: Every day | ORAL | 1 refills | Status: DC

## 2023-02-08 NOTE — Telephone Encounter (Signed)
Medication Refill - Medication:  rosuvastatin (CRESTOR) 5 MG tablet Completely out  Has the patient contacted their pharmacy? Yes, advised to contact pcp  Preferred Pharmacy (with phone number or street name):  [id number] - [place], [place] - [address]  Phone: [phone] Fax: [phone]  Has the patient been seen for an appointment in the last year OR does the patient have an upcoming appointment? Yes.  F/U on 02/22/23  Patients callback # [phone]

## 2023-02-08 NOTE — Telephone Encounter (Signed)
Requested Prescriptions  Pending Prescriptions Disp Refills   rosuvastatin (CRESTOR) 5 MG tablet 90 tablet 1    Sig: Take 1 tablet (5 mg total) by mouth daily.     Cardiovascular:  Antilipid - Statins 2 Failed - 02/08/2023 12:38 PM      Failed - Lipid Panel in normal range within the last 12 months    Cholesterol, Total  Date Value Ref Range Status  02/01/2023 200 (H) 100 - 199 mg/dL Final   LDL Chol Calc (NIH)  Date Value Ref Range Status  02/01/2023 120 (H) 0 - 99 mg/dL Final   HDL  Date Value Ref Range Status  02/01/2023 67 >39 mg/dL Final   Triglycerides  Date Value Ref Range Status  02/01/2023 70 0 - 149 mg/dL Final         Passed - Cr in normal range and within 360 days    Creatinine, Ser  Date Value Ref Range Status  02/01/2023 0.99 0.57 - 1.00 mg/dL Final         Passed - Patient is not pregnant      Passed - Valid encounter within last 12 months    Recent Outpatient Visits           1 week ago Chronic pansinusitis   [facility] Jacky Kindle, FNP   5 months ago Hypercholesterolemia   [facility], Marzella Schlein, MD   1 year ago Encounter for completion of form with patient   [facility] Caro Laroche, DO   1 year ago Acute nasopharyngitis   [facility]on, Ocie Bob, MD   1 year ago Encounter for annual physical exam   [facility] [place], Marzella Schlein, MD

## 2023-02-22 ENCOUNTER — Encounter: Payer: Self-pay | Admitting: Family Medicine

## 2023-02-22 ENCOUNTER — Ambulatory Visit (INDEPENDENT_AMBULATORY_CARE_PROVIDER_SITE_OTHER): Payer: Medicare Other | Admitting: Family Medicine

## 2023-02-22 VITALS — BP 139/69 | HR 66 | Temp 98.3°F | Resp 12 | Ht 63.0 in | Wt 168.9 lb

## 2023-02-22 DIAGNOSIS — E7800 Pure hypercholesterolemia, unspecified: Secondary | ICD-10-CM

## 2023-02-22 DIAGNOSIS — Z0000 Encounter for general adult medical examination without abnormal findings: Secondary | ICD-10-CM | POA: Diagnosis not present

## 2023-02-22 DIAGNOSIS — Z862 Personal history of diseases of the blood and blood-forming organs and certain disorders involving the immune mechanism: Secondary | ICD-10-CM

## 2023-02-22 DIAGNOSIS — E559 Vitamin D deficiency, unspecified: Secondary | ICD-10-CM

## 2023-02-22 DIAGNOSIS — R7303 Prediabetes: Secondary | ICD-10-CM

## 2023-02-22 DIAGNOSIS — Z1159 Encounter for screening for other viral diseases: Secondary | ICD-10-CM

## 2023-02-22 DIAGNOSIS — Z1231 Encounter for screening mammogram for malignant neoplasm of breast: Secondary | ICD-10-CM

## 2023-02-22 DIAGNOSIS — E669 Obesity, unspecified: Secondary | ICD-10-CM

## 2023-02-22 NOTE — Progress Notes (Signed)
Annual Wellness Visit   Patient: Nicole Jensen, Female    DOB: May 11, 1951, 72 y.o.   MRN: [id number]  Subjective  Chief Complaint  Patient presents with   Medicare Wellness    Nicole Jensen is a 72 y.o. female who presents today for her Annual Wellness Visit. She reports consuming a general diet.  She generally feels well. She reports sleeping well. She does not have additional problems to discuss today.   HPI  Discussed the use of AI scribe software for clinical note transcription with the patient, who gave verbal consent to proceed.  History of Present Illness   The patient, who works in a school and daycare setting, presents for a wellness visit. They express concern about their potential exposure to COVID-19 and inquire about the need for additional vaccinations. They have received the first two doses of the COVID-19 vaccine and have not experienced any significant health issues since. They also mention a recent issue with sinusitis.  The patient has not received a flu shot in several years and is considering getting one. They are also contemplating getting the shingles and pneumonia vaccines. 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 They inquire about potential side effects of the pneumonia and shingles vaccines.  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 They express some difficulty remembering to take their cholesterol medication and ask about the best time to take it. They also inquire about the need for continued mammograms at their age.            Medications: Outpatient Medications Prior to Visit  Medication Sig   azithromycin (ZITHROMAX) 500 MG tablet Take 1 tablet (500 mg total) by mouth daily.   Dextromethorphan-guaiFENesin (MUCINEX DM PO) Take by mouth.   fluticasone (FLONASE) 50 MCG/ACT nasal spray Place 1 spray  into both nostrils daily.   meloxicam (MOBIC) 15 MG tablet Take 1 tablet (15 mg total) by mouth daily.   omeprazole (PRILOSEC) 20 MG capsule Take 1 capsule (20 mg total) by mouth daily.   rosuvastatin (CRESTOR) 5 MG tablet Take 1 tablet (5 mg total) by mouth daily.   sodium chloride (OCEAN) 0.65 % SOLN nasal spray Place 1 spray into both nostrils as needed for congestion.   [DISCONTINUED] aspirin 81 MG tablet Take 81 mg by mouth daily. As needed   No facility-administered medications prior to visit.    No Known Allergies  Patient Care Team: Lenord Fralix, Marzella Schlein, MD as PCP - General (Family Medicine) Irene Limbo., MD (Ophthalmology) Geoffry Paradise, MD as Referring Physician (Specialist) Roseanne Kaufman (Physician Assistant)  ROS      Objective  BP 139/69 (BP Location: Right Arm, Patient Position: Sitting, Cuff Size: Large)   Pulse 66   Temp 98.3 F (36.8 C) (Temporal)   Resp 12   Ht 5\' 3"  (1.6 m)   Wt 168 lb 14.4 oz (76.6 kg)   LMP  (LMP Unknown) Comment: partial hysterectomy  SpO2 99%   BMI 29.92 kg/m    Physical Exam Vitals reviewed.  Constitutional:      General: She is not in acute distress.    Appearance: Normal appearance. She is well-developed. She is not diaphoretic.  HENT:     Head: Normocephalic and atraumatic.     Right Ear: Tympanic membrane, ear canal and external ear normal.  Left Ear: Tympanic membrane, ear canal and external ear normal.     Nose: Nose normal.     Mouth/Throat:     Mouth: Mucous membranes are moist.     Pharynx: Oropharynx is clear. No oropharyngeal exudate.  Eyes:     General: No scleral icterus.    Conjunctiva/sclera: Conjunctivae normal.     Pupils: Pupils are equal, round, and reactive to light.  Neck:     Thyroid: No thyromegaly.  Cardiovascular:     Rate and Rhythm: Normal rate and regular rhythm.     Heart sounds: Normal heart sounds. No murmur heard. Pulmonary:     Effort: Pulmonary effort is normal. No  respiratory distress.     Breath sounds: Normal breath sounds. No wheezing or rales.  Abdominal:     General: There is no distension.     Palpations: Abdomen is soft.     Tenderness: There is no abdominal tenderness.  Musculoskeletal:        General: No deformity.     Cervical back: Neck supple.     Right lower leg: No edema.     Left lower leg: No edema.  Lymphadenopathy:     Cervical: No cervical adenopathy.  Skin:    General: Skin is warm and dry.     Findings: No rash.  Neurological:     Mental Status: She is alert and oriented to person, place, and time. Mental status is at baseline.     Gait: Gait normal.  Psychiatric:        Mood and Affect: Mood normal.        Behavior: Behavior normal.        Thought Content: Thought content normal.     Most recent functional status assessment:    02/22/2023    8:38 AM  In your present state of health, do you have any difficulty performing the following activities:  Hearing? 0  Vision? 0  Difficulty concentrating or making decisions? 0  Walking or climbing stairs? 0  Dressing or bathing? 0  Doing errands, shopping? 0   Most recent fall risk assessment:    02/22/2023    8:38 AM  Fall Risk   Falls in the past year? 0  Number falls in past yr: 0  Injury with Fall? 0  Risk for fall due to : No Fall Risks  Follow up Falls evaluation completed    Most recent depression screenings:    02/22/2023    8:38 AM 02/01/2023    3:03 PM  PHQ 2/9 Scores  PHQ - 2 Score 0 0   Most recent cognitive screening:     No data to display         Most recent Audit-C alcohol use screening    02/22/2023    8:38 AM  Alcohol Use Disorder Test (AUDIT)  1. How often do you have a drink containing alcohol? 0  2. How many drinks containing alcohol do you have on a typical day when you are drinking? 0  3. How often do you have six or more drinks on one occasion? 0  AUDIT-C Score 0   A score of 3 or more in women, and 4 or more in men indicates  increased risk for alcohol abuse, EXCEPT if all of the points are from question 1   Vision/Hearing Screen: No results found.    No results found for any visits on 02/22/23.    Assessment & Plan   Annual wellness visit  done today including the all of the following: Reviewed patient's Family Medical History Reviewed and updated list of patient's medical providers Assessment of cognitive impairment was done Assessed patient's functional ability Established a written schedule for health screening services Health Risk Assessent Completed and Reviewed  Exercise Activities and Dietary recommendations  Goals      DIET - INCREASE WATER INTAKE     Recommend to drink at least 6-8 8oz glasses of water per day.        Immunization History  Administered Date(s) Administered   Influenza Split 09/01/2010   PFIZER(Purple Top)SARS-COV-2 Vaccination 09/11/2019, 10/02/2019   PPD Test 02/23/2019   Tdap 12/02/2008, 12/18/2019    Health Maintenance  Topic Date Due   Hepatitis C Screening  Never done   Zoster Vaccines- Shingrix (1 of 2) Never done   Pneumonia Vaccine 54+ Years old (1 of 1 - PCV) Never done   COVID-19 Vaccine (3 - Pfizer risk series) 10/30/2019   MAMMOGRAM  09/27/2022   INFLUENZA VACCINE  10/14/2023 (Originally 02/14/2023)   Medicare Annual Wellness (AWV)  02/22/2024   Colonoscopy  03/07/2024   DTaP/Tdap/Td (3 - Td or Tdap) 12/17/2029   DEXA SCAN  Completed   HPV VACCINES  Aged Out     Discussed health benefits of physical activity, and encouraged her to engage in regular exercise appropriate for her age and condition.    Problem List Items Addressed This Visit       Other   Hypercholesterolemia   Relevant Orders   Lipid Panel With LDL/HDL Ratio   Avitaminosis D   Relevant Orders   VITAMIN D 25 Hydroxy (Vit-D Deficiency, Fractures)   Prediabetes   Relevant Orders   Hemoglobin A1c   History of anemia   Relevant Orders   CBC with Differential/Platelet    Other Visit Diagnoses     Encounter for annual wellness visit (AWV) in Medicare patient    -  Primary   Relevant Orders   MM 3D SCREENING MAMMOGRAM BILATERAL BREAST   Encounter for annual physical exam       Relevant Orders   Comprehensive metabolic panel   Lipid Panel With LDL/HDL Ratio   VITAMIN D 25 Hydroxy (Vit-D Deficiency, Fractures)   Hemoglobin A1c   CBC with Differential/Platelet   Hepatitis C Antibody   Obesity (BMI 30-39.9)       Need for hepatitis C screening test       Relevant Orders   Hepatitis C Antibody   Encounter for screening mammogram for malignant neoplasm of breast       Relevant Orders   MM 3D SCREENING MAMMOGRAM BILATERAL BREAST          Vaccination Status Patient has received two doses of COVID-19 vaccine and is considering booster. 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 Discussed upcoming availability of updated COVID-19 booster, as well as other recommended vaccines including flu, pneumonia, and RSV. -Recommend COVID-19 booster when available next month. -Consider flu vaccine when available. -Consider one-time pneumonia vaccine. -Consider one-time RSV vaccine, currently available.  Mammogram Last mammogram was 1.5 years ago. Discussed ongoing need for mammograms in patients with estimated 10+ years of life expectancy. -Order mammogram.  Labs Discussed need for routine labs including blood sugar, cholesterol, blood counts, and vitamin D. Also discussed one-time Hepatitis C screening. -Order routine labs and Hepatitis C screening.  Medication Adherence Patient reports forgetting to take cholesterol medication. Discussed strategies for improving adherence. -Recommend taking all medications, including cholesterol  medication and vitamins, at bedtime to improve adherence.        Return in about 1 year (around 02/22/2024) for AWV, CPE.     Shirlee Latch, MD

## 2023-02-22 NOTE — Patient Instructions (Signed)
Call [facility] to schedule a mammogram [phone]  

## 2023-03-05 ENCOUNTER — Other Ambulatory Visit: Payer: Self-pay

## 2023-03-05 DIAGNOSIS — R7989 Other specified abnormal findings of blood chemistry: Secondary | ICD-10-CM

## 2023-03-21 ENCOUNTER — Ambulatory Visit
Admission: RE | Admit: 2023-03-21 | Discharge: 2023-03-21 | Disposition: A | Discharge status: Home / Self Care | Payer: Medicare Other | Source: Ambulatory Visit | Attending: Family Medicine | Admitting: Family Medicine

## 2023-03-21 DIAGNOSIS — Z1231 Encounter for screening mammogram for malignant neoplasm of breast: Secondary | ICD-10-CM | POA: Insufficient documentation

## 2023-10-25 DIAGNOSIS — M1611 Unilateral primary osteoarthritis, right hip: Secondary | ICD-10-CM | POA: Diagnosis not present

## 2023-12-05 DIAGNOSIS — H3552 Pigmentary retinal dystrophy: Secondary | ICD-10-CM | POA: Diagnosis not present

## 2023-12-05 DIAGNOSIS — H524 Presbyopia: Secondary | ICD-10-CM | POA: Diagnosis not present

## 2023-12-26 ENCOUNTER — Ambulatory Visit (INDEPENDENT_AMBULATORY_CARE_PROVIDER_SITE_OTHER): Admitting: Family Medicine

## 2023-12-26 ENCOUNTER — Encounter: Payer: Self-pay | Admitting: Family Medicine

## 2023-12-26 VITALS — BP 125/61 | HR 63 | Temp 98.3°F | Ht 64.0 in | Wt 164.5 lb

## 2023-12-26 DIAGNOSIS — M25551 Pain in right hip: Secondary | ICD-10-CM

## 2023-12-26 DIAGNOSIS — Z01818 Encounter for other preprocedural examination: Secondary | ICD-10-CM | POA: Diagnosis not present

## 2023-12-26 NOTE — Progress Notes (Signed)
 Established Patient Office Visit  Subjective   Patient ID: Nicole Jensen, female    DOB: [birth year]  Age: 73 y.o. MRN: [id number]  Chief Complaint  Patient presents with   Pre-op Exam    Patient reports that she is here for a follow up because she has not been seen by the provider since 02/2023.  Patient is having hip surgery, states she is in a lot of pain.     Medical Management of Chronic Issues    Colonoscopy- ok to order    Pt is a 73 y.o. female with history of hysterectomy, cholecystectomy, bariatric surgery, and provoked pulmonary embolism who is here for preoperative clearance for right total hip arthroplasty. She is currently not taking any medicines and has no current concerns. She denies any chest pain or shortness of breath and endorses avery active lifestyle. She works multiple jobs and is on her feet frequently.  1) High Risk Cardiac Conditions  1) Recent MI - No.  2) Decompensated Heart Failure - No.  3) Unstable angina - No.  4) Symptomatic arrythmia - No.  5) Sx Valvular Disease - No.  2) Intermediate Risk Factors - DM, CKD, CVA, CHF, CAD - No.  2) Functional Status - > 4 mets (Walk, run, climb stairs) Yes.  [facility] Activity Status Index: 29.95 (6.05 Mets)  3) Surgery Specific Risk-Intermediate (Carotid, Head and Neck, Orthopaedic )          4) Further Noninvasive evaluation -   1) EKG - Yes.  Per surgeon request, not be risk factors    5) Need for medical therapy - Beta Blocker, Statins indicated ? No  Past Medical History:  Diagnosis Date   Arthritis    Leg cramps    right leg   Past Surgical History:  Procedure Laterality Date   ABDOMINAL HYSTERECTOMY     partial. pt reports she has cervix   BARIATRIC SURGERY  2019   COLONOSCOPY N/A 03/07/2021   Procedure: COLONOSCOPY;  Surgeon: Nicole Daily, MD;  Location: [facility] ENDOSCOPY;  Service: Gastroenterology;  Laterality: N/A;   COLONOSCOPY WITH PROPOFOL       Review of Systems  Constitutional:   Negative for chills, fever and weight loss.  HENT: Negative.    Eyes: Negative.   Respiratory:  Negative for cough and shortness of breath.   Cardiovascular:  Negative for chest pain, palpitations and leg swelling.  Gastrointestinal:  Negative for blood in stool, heartburn, nausea and vomiting.  Genitourinary:  Negative for hematuria and urgency.  Musculoskeletal:  Positive for joint pain and myalgias.  Skin:  Negative for rash.  Neurological:  Negative for dizziness, sensory change, focal weakness, weakness and headaches.  Endo/Heme/Allergies:  Does not bruise/bleed easily.  Psychiatric/Behavioral:  Negative for depression. The patient is not nervous/anxious.      Objective:     BP 125/61 (BP Location: Left Arm, Patient Position: Sitting, Cuff Size: Normal)   Pulse 63   Temp 98.3 F (36.8 C) (Oral)   Ht 5' 4 (1.626 m)   Wt 164 lb 8 oz (74.6 kg)   LMP  (LMP Unknown) Comment: partial hysterectomy  SpO2 100%   BMI 28.24 kg/m   BP Readings from Last 3 Encounters:  12/26/23 125/61  02/22/23 139/69  02/01/23 114/66   Wt Readings from Last 3 Encounters:  12/26/23 164 lb 8 oz (74.6 kg)  02/22/23 168 lb 14.4 oz (76.6 kg)  02/01/23 167 lb 3.2 oz (75.8 kg)  Physical Exam Constitutional:      General: She is not in acute distress.    Appearance: Normal appearance. She is not toxic-appearing.  HENT:     Head: Normocephalic and atraumatic.     Right Ear: External ear normal.     Nose: Nose normal.     Mouth/Throat:     Mouth: Mucous membranes are moist.     Pharynx: Oropharynx is clear. No oropharyngeal exudate.   Eyes:     General: No scleral icterus.    Extraocular Movements: Extraocular movements intact.     Pupils: Pupils are equal, round, and reactive to light.    Cardiovascular:     Rate and Rhythm: Normal rate and regular rhythm.     Heart sounds: No murmur heard.    No friction rub. No gallop.  Pulmonary:     Effort: Pulmonary effort is normal. No  respiratory distress.     Breath sounds: No wheezing or rales.  Abdominal:     General: Abdomen is flat. There is no distension.     Tenderness: There is no abdominal tenderness.   Musculoskeletal:        General: Normal range of motion.     Cervical back: Normal range of motion.     Right lower leg: No edema.     Left lower leg: No edema.   Skin:    General: Skin is warm and dry.     Coloration: Skin is not jaundiced or pale.     Findings: No bruising.   Neurological:     General: No focal deficit present.     Mental Status: She is alert and oriented to person, place, and time. Mental status is at baseline.     Cranial Nerves: No cranial nerve deficit.     Motor: No weakness.   Psychiatric:        Mood and Affect: Mood normal.        Behavior: Behavior normal.     No results found for any visits on 12/26/23.  Last CBC Lab Results  Component Value Date   WBC 3.8 02/22/2023   HGB 12.0 02/22/2023   HCT 38.2 02/22/2023   MCV 86 02/22/2023   MCH 26.8 02/22/2023   RDW 14.4 02/22/2023   PLT 198 02/22/2023   Last metabolic panel Lab Results  Component Value Date   GLUCOSE 85 02/22/2023   NA 142 02/22/2023   K 4.6 02/22/2023   CL 107 (H) 02/22/2023   CO2 22 02/22/2023   BUN 14 02/22/2023   CREATININE 0.72 02/22/2023   EGFR 90 02/22/2023   CALCIUM  9.4 02/22/2023   PROT 7.1 02/22/2023   ALBUMIN 4.0 02/22/2023   LABGLOB 3.1 02/22/2023   AGRATIO 1.4 08/23/2022   BILITOT 0.3 02/22/2023   ALKPHOS 106 02/22/2023   AST 36 02/22/2023   ALT 34 (H) 02/22/2023   ANIONGAP 2 (L) 05/27/2015   Last lipids Lab Results  Component Value Date   CHOL 182 02/22/2023   HDL 80 02/22/2023   LDLCALC 91 02/22/2023   TRIG 54 02/22/2023   CHOLHDL 3.0 02/01/2023   Last hemoglobin A1c Lab Results  Component Value Date   HGBA1C 5.7 (H) 02/22/2023    EKG: NSR with rate of 58  The 10-year ASCVD risk score (Arnett DK, et al., 2019) is: 11.7%    Assessment & Plan:   Problem  List Items Addressed This Visit       Other   Right hip pain  Other Visit Diagnoses       Pre-op evaluation    -  Primary   Relevant Orders   CBC w/Diff/Platelet   Basic Metabolic Panel (BMET)   HgB A1c   Urinalysis, Routine w reflex microscopic   EKG 12-Lead       I have independently evaluated patient.  Nicole STONESIFER is a 73 y.o. female who is low risk for a moderate risk surgery.  There are not any modifiable risk factors (smoking, etc).  Nicole Jensen's RCRI/NSQIP calculation for MACE is: odds for any serious complications 1.9% and less than average.   Review meds: Not currently taking any medicines, nothing needs to be held. Patient last took warfarin for provoked DVT in the 1990s but has not taken any since.  EKG per ortho request, CBC, BMP, A1c, and Urinalysis have been ordered.  Return in about 3 months (around 03/27/2024) for CPE.    Monda Angry, Medical Student   Patient seen along with MS3 student, Luther Saltness. I personally evaluated this patient along with the student, and verified all aspects of the history, physical exam, and medical decision making as documented by the student. I agree with the student's documentation and have made all necessary edits.  Samba Cumba, Stan Eans, MD, MPH [facility]

## 2023-12-27 DIAGNOSIS — Z01818 Encounter for other preprocedural examination: Secondary | ICD-10-CM | POA: Diagnosis not present

## 2023-12-28 LAB — BASIC METABOLIC PANEL WITH GFR
BUN/Creatinine Ratio: 17 (ref 12–28)
BUN: 13 mg/dL (ref 8–27)
CO2: 22 mmol/L (ref 20–29)
Calcium: 9.4 mg/dL (ref 8.7–10.3)
Chloride: 102 mmol/L (ref 96–106)
Creatinine, Ser: 0.78 mg/dL (ref 0.57–1.00)
Glucose: 90 mg/dL (ref 70–99)
Potassium: 4.5 mmol/L (ref 3.5–5.2)
Sodium: 138 mmol/L (ref 134–144)
eGFR: 81 mL/min/{1.73_m2} (ref >59)

## 2023-12-28 LAB — HEMOGLOBIN A1C
Est. average glucose Bld gHb Est-mCnc: 123 mg/dL
Hgb A1c MFr Bld: 5.9 % — ABNORMAL HIGH (ref 4.8–5.6)

## 2023-12-28 LAB — CBC WITH DIFFERENTIAL/PLATELET
Basophils Absolute: 0 10*3/uL (ref 0.0–0.2)
Basos: 0 %
EOS (ABSOLUTE): 0.1 10*3/uL (ref 0.0–0.4)
Eos: 2 %
Hematocrit: 38.4 % (ref 34.0–46.6)
Hemoglobin: 12 g/dL (ref 11.1–15.9)
Immature Grans (Abs): 0 10*3/uL (ref 0.0–0.1)
Immature Granulocytes: 0 %
Lymphocytes Absolute: 1.3 10*3/uL (ref 0.7–3.1)
Lymphs: 44 %
MCH: 27.5 pg (ref 26.6–33.0)
MCHC: 31.3 g/dL — ABNORMAL LOW (ref 31.5–35.7)
MCV: 88 fL (ref 79–97)
Monocytes Absolute: 0.3 10*3/uL (ref 0.1–0.9)
Monocytes: 11 %
Neutrophils Absolute: 1.3 10*3/uL — ABNORMAL LOW (ref 1.4–7.0)
Neutrophils: 43 %
Platelets: 195 10*3/uL (ref 150–450)
RBC: 4.37 x10E6/uL (ref 3.77–5.28)
RDW: 14.4 % (ref 11.7–15.4)
WBC: 3.1 10*3/uL — ABNORMAL LOW (ref 3.4–10.8)

## 2023-12-28 LAB — URINALYSIS, ROUTINE W REFLEX MICROSCOPIC
Bilirubin, UA: NEGATIVE
Glucose, UA: NEGATIVE
Ketones, UA: NEGATIVE
Nitrite, UA: NEGATIVE
Protein,UA: NEGATIVE
Specific Gravity, UA: 1.018 (ref 1.005–1.030)
Urobilinogen, Ur: 0.2 mg/dL (ref 0.2–1.0)
pH, UA: 7 (ref 5.0–7.5)

## 2023-12-28 LAB — MICROSCOPIC EXAMINATION
Bacteria, UA: NONE SEEN
Casts: NONE SEEN /LPF

## 2023-12-30 ENCOUNTER — Ambulatory Visit: Payer: Self-pay | Admitting: Family Medicine

## 2023-12-30 DIAGNOSIS — Z1211 Encounter for screening for malignant neoplasm of colon: Secondary | ICD-10-CM

## 2023-12-31 NOTE — Telephone Encounter (Signed)
 Copied from CRM [id number]. Topic: Clinical - Lab/Test Results >> Dec 31, 2023 10:36 AM Juluis Ok wrote: Reason for CRM: Patient returning call. Relayed lab results per note, verbatim. Patient has additional questions and request a callback.

## 2024-01-01 ENCOUNTER — Other Ambulatory Visit (INDEPENDENT_AMBULATORY_CARE_PROVIDER_SITE_OTHER)

## 2024-01-01 DIAGNOSIS — R319 Hematuria, unspecified: Secondary | ICD-10-CM | POA: Diagnosis not present

## 2024-01-01 LAB — POCT URINALYSIS DIPSTICK
Bilirubin, UA: NEGATIVE
Glucose, UA: NEGATIVE
Ketones, UA: NEGATIVE
Leukocytes, UA: NEGATIVE
Nitrite, UA: NEGATIVE
Protein, UA: NEGATIVE
Spec Grav, UA: 1.02 (ref 1.010–1.025)
Urobilinogen, UA: 0.2 U/dL
pH, UA: 6 (ref 5.0–8.0)

## 2024-01-02 LAB — URINALYSIS, ROUTINE W REFLEX MICROSCOPIC
Bilirubin, UA: NEGATIVE
Glucose, UA: NEGATIVE
Ketones, UA: NEGATIVE
Nitrite, UA: NEGATIVE
Protein,UA: NEGATIVE
RBC, UA: NEGATIVE
Specific Gravity, UA: 1.021 (ref 1.005–1.030)
Urobilinogen, Ur: 0.2 mg/dL (ref 0.2–1.0)
pH, UA: 5 (ref 5.0–7.5)

## 2024-01-02 LAB — MICROSCOPIC EXAMINATION
Bacteria, UA: NONE SEEN
Casts: NONE SEEN /LPF

## 2024-01-02 LAB — SPECIMEN STATUS REPORT

## 2024-01-03 ENCOUNTER — Ambulatory Visit: Payer: Self-pay | Admitting: Family Medicine

## 2024-01-03 LAB — URINE CULTURE

## 2024-01-03 LAB — SPECIMEN STATUS REPORT

## 2024-01-22 DIAGNOSIS — Z6828 Body mass index (BMI) 28.0-28.9, adult: Secondary | ICD-10-CM | POA: Diagnosis not present

## 2024-01-22 DIAGNOSIS — M25751 Osteophyte, right hip: Secondary | ICD-10-CM | POA: Diagnosis not present

## 2024-01-22 DIAGNOSIS — M1611 Unilateral primary osteoarthritis, right hip: Secondary | ICD-10-CM | POA: Diagnosis not present

## 2024-01-23 DIAGNOSIS — M1611 Unilateral primary osteoarthritis, right hip: Secondary | ICD-10-CM | POA: Diagnosis not present

## 2024-01-23 DIAGNOSIS — Z6828 Body mass index (BMI) 28.0-28.9, adult: Secondary | ICD-10-CM | POA: Diagnosis not present

## 2024-01-23 DIAGNOSIS — M25751 Osteophyte, right hip: Secondary | ICD-10-CM | POA: Diagnosis not present

## 2024-01-24 DIAGNOSIS — M25551 Pain in right hip: Secondary | ICD-10-CM | POA: Diagnosis not present

## 2024-01-24 DIAGNOSIS — M25651 Stiffness of right hip, not elsewhere classified: Secondary | ICD-10-CM | POA: Diagnosis not present

## 2024-01-27 DIAGNOSIS — M25551 Pain in right hip: Secondary | ICD-10-CM | POA: Diagnosis not present

## 2024-01-27 DIAGNOSIS — M25651 Stiffness of right hip, not elsewhere classified: Secondary | ICD-10-CM | POA: Diagnosis not present

## 2024-01-30 ENCOUNTER — Ambulatory Visit
Admission: RE | Admit: 2024-01-30 | Discharge: 2024-01-30 | Disposition: A | Discharge status: Home / Self Care | Source: Ambulatory Visit | Attending: Physician Assistant | Admitting: Physician Assistant

## 2024-01-30 ENCOUNTER — Other Ambulatory Visit: Payer: Self-pay | Admitting: Physician Assistant

## 2024-01-30 DIAGNOSIS — R2241 Localized swelling, mass and lump, right lower limb: Secondary | ICD-10-CM

## 2024-01-30 DIAGNOSIS — Z96641 Presence of right artificial hip joint: Secondary | ICD-10-CM | POA: Diagnosis not present

## 2024-01-30 DIAGNOSIS — M7989 Other specified soft tissue disorders: Secondary | ICD-10-CM | POA: Diagnosis not present

## 2024-01-31 DIAGNOSIS — M25551 Pain in right hip: Secondary | ICD-10-CM | POA: Diagnosis not present

## 2024-01-31 DIAGNOSIS — M25651 Stiffness of right hip, not elsewhere classified: Secondary | ICD-10-CM | POA: Diagnosis not present

## 2024-02-04 DIAGNOSIS — Z4889 Encounter for other specified surgical aftercare: Secondary | ICD-10-CM | POA: Diagnosis not present

## 2024-02-04 DIAGNOSIS — M25651 Stiffness of right hip, not elsewhere classified: Secondary | ICD-10-CM | POA: Diagnosis not present

## 2024-02-04 DIAGNOSIS — M25551 Pain in right hip: Secondary | ICD-10-CM | POA: Diagnosis not present

## 2024-02-07 DIAGNOSIS — M25551 Pain in right hip: Secondary | ICD-10-CM | POA: Diagnosis not present

## 2024-02-07 DIAGNOSIS — M25651 Stiffness of right hip, not elsewhere classified: Secondary | ICD-10-CM | POA: Diagnosis not present

## 2024-02-18 DIAGNOSIS — M25551 Pain in right hip: Secondary | ICD-10-CM | POA: Diagnosis not present

## 2024-02-18 DIAGNOSIS — M25651 Stiffness of right hip, not elsewhere classified: Secondary | ICD-10-CM | POA: Diagnosis not present

## 2024-02-20 DIAGNOSIS — M25651 Stiffness of right hip, not elsewhere classified: Secondary | ICD-10-CM | POA: Diagnosis not present

## 2024-02-20 DIAGNOSIS — M25551 Pain in right hip: Secondary | ICD-10-CM | POA: Diagnosis not present

## 2024-02-25 DIAGNOSIS — M25551 Pain in right hip: Secondary | ICD-10-CM | POA: Diagnosis not present

## 2024-02-25 DIAGNOSIS — M25651 Stiffness of right hip, not elsewhere classified: Secondary | ICD-10-CM | POA: Diagnosis not present

## 2024-02-27 DIAGNOSIS — M25651 Stiffness of right hip, not elsewhere classified: Secondary | ICD-10-CM | POA: Diagnosis not present

## 2024-02-27 DIAGNOSIS — M25551 Pain in right hip: Secondary | ICD-10-CM | POA: Diagnosis not present

## 2024-03-03 DIAGNOSIS — Z4889 Encounter for other specified surgical aftercare: Secondary | ICD-10-CM | POA: Diagnosis not present

## 2024-03-26 ENCOUNTER — Other Ambulatory Visit: Payer: Self-pay | Admitting: Family Medicine

## 2024-03-26 DIAGNOSIS — J309 Allergic rhinitis, unspecified: Secondary | ICD-10-CM

## 2024-03-26 MED ORDER — FLUTICASONE PROPIONATE 50 MCG/ACT NA SUSP: 1.0000 | Freq: Every day | NASAL | 1 refills | Status: DC

## 2024-03-26 NOTE — Telephone Encounter (Signed)
 Requested Prescriptions  Pending Prescriptions Disp Refills   fluticasone  (FLONASE ) 50 MCG/ACT nasal spray 16 mL 1    Sig: Place 1 spray into both nostrils daily.     Ear, Nose, and Throat: Nasal Preparations - Corticosteroids Passed - 03/26/2024  5:55 PM      Passed - Valid encounter within last 12 months    Recent Outpatient Visits           3 months ago Pre-op evaluation   [facility] [place], Jon HERO, MD

## 2024-03-26 NOTE — Telephone Encounter (Signed)
 Copied from CRM [id number]. Topic: Clinical - Medication Refill >> Mar 26, 2024  8:00 AM Montie POUR wrote: Medication: fluticasone  (FLONASE ) 50 MCG/ACT nasal spray  Has the patient contacted their pharmacy? Yes (Agent: If no, request that the patient contact the pharmacy for the refill. If patient does not wish to contact the pharmacy document the reason why and proceed with request.) (Agent: If yes, when and what did the pharmacy advise?) Pharmacy needs order to refill  This is the patient's preferred pharmacy:  [id number] - [place], [place] - [address] [place] [address] Phone: [phone] Fax: [phone]  Is this the correct pharmacy for this prescription? Yes If no, delete pharmacy and type the correct one.   Has the prescription been filled recently? No  Is the patient out of the medication? Yes  Has the patient been seen for an appointment in the last year OR does the patient have an upcoming appointment? Yes  Can we respond through MyChart? No  Agent: Please be advised that Rx refills may take up to 3 business days. We ask that you follow-up with your pharmacy.

## 2024-04-02 ENCOUNTER — Encounter: Admitting: Family Medicine

## 2024-05-12 DIAGNOSIS — Z4889 Encounter for other specified surgical aftercare: Secondary | ICD-10-CM | POA: Diagnosis not present

## 2024-07-07 ENCOUNTER — Ambulatory Visit (INDEPENDENT_AMBULATORY_CARE_PROVIDER_SITE_OTHER)

## 2024-07-07 VITALS — BP 138/80 | Ht 64.0 in | Wt 160.3 lb

## 2024-07-07 DIAGNOSIS — Z0000 Encounter for general adult medical examination without abnormal findings: Secondary | ICD-10-CM

## 2024-07-07 DIAGNOSIS — Z1231 Encounter for screening mammogram for malignant neoplasm of breast: Secondary | ICD-10-CM

## 2024-07-07 NOTE — Progress Notes (Signed)
 "  No chief complaint on file.    Subjective:   Nicole Jensen is a 73 y.o. female who presents for a Medicare Annual Wellness Visit.  Visit info / Clinical Intake: Persons participating in visit and providing information:: patient Medicare Wellness Visit Mode:: In-person (required for WTM) Interpreter Needed?: No Pre-visit prep was completed: yes AWV questionnaire completed by patient prior to visit?: no Living arrangements:: with family/others (grandson) Patient's Overall Health Status Rating: good Typical amount of pain: some Does pain affect daily life?: no Are you currently prescribed opioids?: no  Dietary Habits and Nutritional Risks How many meals a day?: 2 Eats fruit and vegetables daily?: (!) no Most meals are obtained by: having others provide food In the last 2 weeks, have you had any of the following?: none Diabetic:: no  Functional Status Activities of Daily Living (to include ambulation/medication): Independent Ambulation: Independent Medication Administration: Independent Home Management (perform basic housework or laundry): Independent Manage your own finances?: yes Primary transportation is: driving Concerns about vision?: no *vision screening is required for WTM* (rx glasses- bifocals-Dr.Bell) Concerns about hearing?: no  Fall Screening Falls in the past year?: 0 Number of falls in past year: 0 Was there an injury with Fall?: 0 Fall Risk Category Calculator: 0 Patient Fall Risk Level: Low Fall Risk  Fall Risk Patient at Risk for Falls Due to: No Fall Risks Fall risk Follow up: Falls evaluation completed; Falls prevention discussed  Home and Transportation Safety: All rugs have non-skid backing?: yes All stairs or steps have railings?: yes Grab bars in the bathtub or shower?: (!) no (COMMODE CHAIR) Have non-skid surface in bathtub or shower?: yes Good home lighting?: yes Regular seat belt use?: yes Hospital stays in the last year::  no  Cognitive Assessment Difficulty concentrating, remembering, or making decisions? : no Will 6CIT or Mini Cog be Completed: yes What year is it?: 0 points What month is it?: 0 points Give patient an address phrase to remember (5 components): [address] [place], [place] About what time is it?: 0 points Count backwards from 20 to 1: 0 points Say the months of the year in reverse: 0 points Repeat the address phrase from earlier: 0 points 6 CIT Score: 0 points  Advance Directives (For Healthcare) Does Patient Have a Medical Advance Directive?: No Would patient like information on creating a medical advance directive?: No - Patient declined  Reviewed/Updated  Reviewed/Updated: Reviewed All (Medical, Surgical, Family, Medications, Allergies, Care Teams, Patient Goals)    Allergies (verified) Patient has no known allergies.   Current Medications (verified) Outpatient Encounter Medications as of 07/07/2024  Medication Sig   fluticasone  (FLONASE ) 50 MCG/ACT nasal spray Place 1 spray into both nostrils daily.   No facility-administered encounter medications on file as of 07/07/2024.    History: Past Medical History:  Diagnosis Date   Arthritis    Leg cramps    right leg   Past Surgical History:  Procedure Laterality Date   ABDOMINAL HYSTERECTOMY     partial. pt reports she has cervix   BARIATRIC SURGERY  2019   COLONOSCOPY N/A 03/07/2021   Procedure: COLONOSCOPY;  Surgeon: Unk Corinn Skiff, MD;  Location: [facility] ENDOSCOPY;  Service: Gastroenterology;  Laterality: N/A;   COLONOSCOPY WITH PROPOFOL      Family History  Problem Relation Age of Onset   Hypertension Mother    Lung cancer Mother    Colon cancer Father    Hypertension Sister    Hypertension Sister  Breast cancer Neg Hx    Social History   Occupational History   Occupation: Full-Time    Employer: [employer]  Tobacco Use   Smoking status: Never   Smokeless tobacco: Never  Vaping Use   Vaping status:  Never Used  Substance and Sexual Activity   Alcohol use: No    Alcohol/week: 0.0 standard drinks of alcohol   Drug use: No   Sexual activity: Never   Tobacco Counseling Counseling given: Not Answered  SDOH Screenings   Food Insecurity: No Food Insecurity (07/07/2024)  Housing: Unknown (07/07/2024)  Transportation Needs: No Transportation Needs (07/07/2024)  Utilities: Not At Risk (07/07/2024)  Alcohol Screen: Low Risk (02/22/2023)  Depression (PHQ2-9): Low Risk (07/07/2024)  Physical Activity: Sufficiently Active (07/07/2024)  Social Connections: Moderately Isolated (07/07/2024)  Stress: No Stress Concern Present (07/07/2024)  Tobacco Use: Low Risk (07/07/2024)  Health Literacy: Adequate Health Literacy (07/07/2024)   See flowsheets for full screening details  Depression Screen PHQ 2 & 9 Depression Scale- Over the past 2 weeks, how often have you been bothered by any of the following problems? Little interest or pleasure in doing things: 0 Feeling down, depressed, or hopeless (PHQ Adolescent also includes...irritable): 0 PHQ-2 Total Score: 0 Trouble falling or staying asleep, or sleeping too much: 0 Feeling tired or having little energy: 0 Poor appetite or overeating (PHQ Adolescent also includes...weight loss): 0 Feeling bad about yourself - or that you are a failure or have let yourself or your family down: 0 Trouble concentrating on things, such as reading the newspaper or watching television (PHQ Adolescent also includes...like school work): 0 Moving or speaking so slowly that other people could have noticed. Or the opposite - being so fidgety or restless that you have been moving around a lot more than usual: 0 Thoughts that you would be better off dead, or of hurting yourself in some way: 0 PHQ-9 Total Score: 0 If you checked off any problems, how difficult have these problems made it for you to do your work, take care of things at home, or get along with other people?: Not  difficult at all  Depression Treatment Depression Interventions/Treatment : EYV7-0 Score <4 Follow-up Not Indicated     Goals Addressed             This Visit's Progress    DIET - EAT MORE FRUITS AND VEGETABLES               Objective:    Today's Vitals   07/07/24 0806  BP: 138/80  Weight: 160 lb 4.8 oz (72.7 kg)  Height: 5' 4 (1.626 m)   Body mass index is 27.52 kg/m.  Hearing/Vision screen Hearing Screening - Comments:: NO AIDS Vision Screening - Comments:: WEARS BIFOCALS- DR.BELL Immunizations and Health Maintenance Health Maintenance  Topic Date Due   Zoster Vaccines- Shingrix (1 of 2) Never done   Pneumococcal Vaccine: 50+ Years (1 of 1 - PCV) Never done   COVID-19 Vaccine (3 - Pfizer risk series) 10/30/2019   Influenza Vaccine  02/14/2024   Colonoscopy  03/07/2024   Mammogram  03/20/2024   Medicare Annual Wellness (AWV)  07/07/2025   DTaP/Tdap/Td (3 - Td or Tdap) 12/17/2029   Bone Density Scan  Completed   Hepatitis C Screening  Completed   Meningococcal B Vaccine  Aged Out        Assessment/Plan:  This is a routine wellness examination for Nicole Jensen.  Patient Care Team: Myrla Jon HERO, MD as PCP - General (Family  Medicine) Carolee Manus DASEN., MD (Ophthalmology) Wolm Thom HERO, MD as Referring Physician (Specialist) Stout, Sarah A, PA-C (Physician Assistant)  I have personally reviewed and noted the following in the patients chart:   Medical and social history Use of alcohol, tobacco or illicit drugs  Current medications and supplements including opioid prescriptions. Functional ability and status Nutritional status Physical activity Advanced directives List of other physicians Hospitalizations, surgeries, and ER visits in previous 12 months Vitals Screenings to include cognitive, depression, and falls Referrals and appointments  No orders of the defined types were placed in this encounter.  In addition, I have reviewed and discussed  with patient certain preventive protocols, quality metrics, and best practice recommendations. A written personalized care plan for preventive services as well as general preventive health recommendations were provided to patient.   Jhonnie GORMAN Das, LPN   [id number]   Return in 1 year (on 07/07/2025).  After Visit Summary: (In Person-Printed) AVS printed and given to the patient  Nurse Notes: DECLINES FLU, PNA & SHINGRIX SHOTS; MAMMOGRAM ORDERED; NEEDS TO RE-SCHEDULE APPT FOR COLONOSCOPY; UTD  ON BDS "

## 2024-07-07 NOTE — Patient Instructions (Addendum)
 Ms. Billing,  Thank you for taking the time for your Medicare Wellness Visit. I appreciate your continued commitment to your health goals. Please review the care plan we discussed, and feel free to reach out if I can assist you further.  Please note that Annual Wellness Visits do not include a physical exam. Some assessments may be limited, especially if the visit was conducted virtually. If needed, we may recommend an in-person follow-up with your provider.  Ongoing Care Seeing your primary care provider every 3 to 6 months helps us  monitor your health and provide consistent, personalized care.   Referrals If a referral was made during today's visit and you haven't received any updates within two weeks, please contact the referred provider directly to check on the status.  REFERRAL FOR MAMMOGRAM SENT: You have an order for:  []   2D Mammogram  [x]   3D Mammogram  []   Bone Density     Please call for appointment:  [facility]  [address] [place] [address] [phone] [facility] [address] [place], [place] [address] [phone] [facility] [address] [place], [place] [address] [phone]   Make sure to wear two-piece clothing.  No lotions, powders, or deodorants the day of the appointment. Make sure to bring picture ID and insurance card.  Bring list of medications you are currently taking including any supplements.   Schedule your [place] screening mammogram through MyChart!   Log into your MyChart account.  Go to 'Visit' (or 'Appointments' if on mobile App) --> Schedule an Appointment  Under 'Select a Reason for Visit' choose the Mammogram Screening option.  Complete the pre-visit questions and select the time and place that best fits your schedule.   Recommended Screenings:  Health Maintenance  Topic Date Due   Zoster  (Shingles) Vaccine (1 of 2) Never done   Pneumococcal Vaccine for age over 33 (1 of 1 - PCV) Never done   COVID-19 Vaccine (3 - Pfizer risk series) 10/30/2019   Flu Shot  02/14/2024   Colon Cancer Screening  03/07/2024   Breast Cancer Screening  03/20/2024   Medicare Annual Wellness Visit  07/07/2025   Osteoporosis screening with Bone Density Scan  07/05/2027   DTaP/Tdap/Td vaccine (3 - Td or Tdap) 12/17/2029   Hepatitis C Screening  Completed   Meningitis B Vaccine  Aged Out     Vision: Annual vision screenings are recommended for early detection of glaucoma, cataracts, and diabetic retinopathy. These exams can also reveal signs of chronic conditions such as diabetes and high blood pressure.  Dental: Annual dental screenings help detect early signs of oral cancer, gum disease, and other conditions linked to overall health, including heart disease and diabetes.  Please see the attached documents for additional preventive care recommendations.   NEXT AWV 07/13/25 @ 8:10 AM IN PERSON

## 2024-07-10 ENCOUNTER — Ambulatory Visit
Admission: RE | Admit: 2024-07-10 | Discharge: 2024-07-10 | Disposition: A | Discharge status: Home / Self Care | Source: Ambulatory Visit | Attending: Family Medicine | Admitting: Family Medicine

## 2024-07-10 DIAGNOSIS — Z1231 Encounter for screening mammogram for malignant neoplasm of breast: Secondary | ICD-10-CM | POA: Diagnosis present

## 2024-07-20 ENCOUNTER — Ambulatory Visit: Payer: Self-pay | Admitting: Family Medicine

## 2024-07-26 ENCOUNTER — Other Ambulatory Visit: Payer: Self-pay | Admitting: Family Medicine

## 2024-07-26 DIAGNOSIS — J309 Allergic rhinitis, unspecified: Secondary | ICD-10-CM

## 2024-08-21 ENCOUNTER — Other Ambulatory Visit: Payer: Self-pay | Admitting: Family Medicine

## 2024-08-21 DIAGNOSIS — J309 Allergic rhinitis, unspecified: Secondary | ICD-10-CM

## 2025-07-13 ENCOUNTER — Ambulatory Visit
